# Patient Record
Sex: Female | Born: 1945 | Race: White | Hispanic: Yes | Smoking: Never smoker
Health system: Southern US, Community
[De-identification: ages and names within clinical notes are randomized; demographics above are authoritative.]

## PROBLEM LIST (undated history)

## (undated) DIAGNOSIS — I671 Cerebral aneurysm, nonruptured: Secondary | ICD-10-CM

## (undated) DIAGNOSIS — R51 Headache: Secondary | ICD-10-CM

## (undated) DIAGNOSIS — K219 Gastro-esophageal reflux disease without esophagitis: Secondary | ICD-10-CM

## (undated) DIAGNOSIS — N201 Calculus of ureter: Secondary | ICD-10-CM

## (undated) DIAGNOSIS — K648 Other hemorrhoids: Secondary | ICD-10-CM

## (undated) DIAGNOSIS — I10 Essential (primary) hypertension: Secondary | ICD-10-CM

## (undated) DIAGNOSIS — R319 Hematuria, unspecified: Secondary | ICD-10-CM

## (undated) DIAGNOSIS — R519 Headache, unspecified: Secondary | ICD-10-CM

## (undated) DIAGNOSIS — E785 Hyperlipidemia, unspecified: Secondary | ICD-10-CM

## (undated) HISTORY — DX: Headache, unspecified: R51.9

## (undated) HISTORY — DX: Headache: R51

## (undated) HISTORY — PX: ABDOMINAL HYSTERECTOMY: SHX81

## (undated) HISTORY — PX: APPENDECTOMY: SHX54

## (undated) HISTORY — PX: CARDIAC CATHETERIZATION: SHX172

---

## 2008-08-26 ENCOUNTER — Inpatient Hospital Stay (HOSPITAL_COMMUNITY): Admission: AD | Admit: 2008-08-26 | Discharge: 2008-08-27 | Payer: Self-pay | Admitting: Cardiovascular Disease

## 2008-10-18 ENCOUNTER — Inpatient Hospital Stay (HOSPITAL_COMMUNITY): Admission: EM | Admit: 2008-10-18 | Discharge: 2008-10-19 | Payer: Self-pay | Admitting: Emergency Medicine

## 2008-10-18 ENCOUNTER — Encounter (INDEPENDENT_AMBULATORY_CARE_PROVIDER_SITE_OTHER): Payer: Self-pay | Admitting: Neurology

## 2008-10-19 HISTORY — PX: TRANSTHORACIC ECHOCARDIOGRAM: SHX275

## 2008-11-09 ENCOUNTER — Encounter: Admission: RE | Admit: 2008-11-09 | Discharge: 2008-11-09 | Payer: Self-pay | Admitting: Cardiovascular Disease

## 2009-07-27 IMAGING — CR DG ABDOMEN 1V
1 series · 1 of 1 positions shown · non-contrast
Comparison: None

CLINICAL DATA: Abdominal pain, bloody stool

ABDOMEN - 1 VIEW

[t abdomen supine]
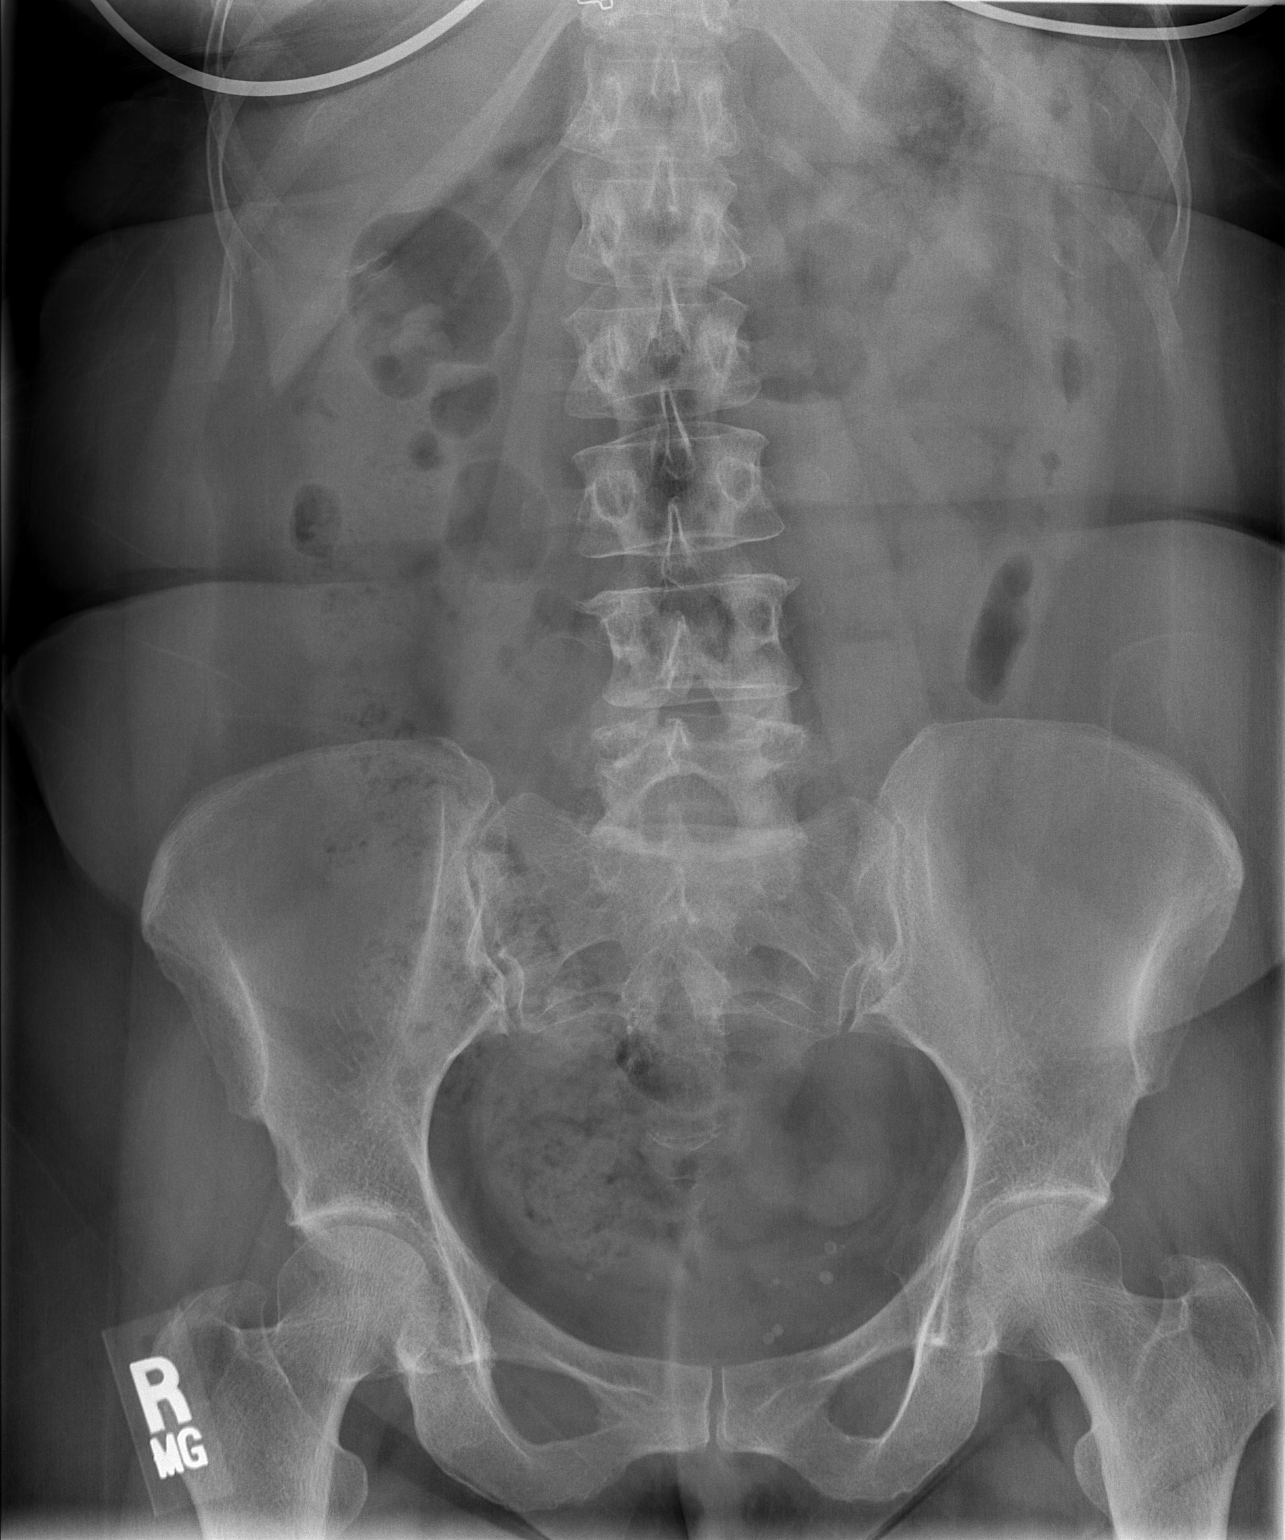

[1 of 1 positions shown; findings below may reference images not displayed]

FINDINGS: Bowel gas pattern is normal.  The colon is decompressed.
This may account for mild apparent prominence of the descending
colonic wall.  Phleboliths project over the left pelvis.  No acute
bony finding.  Presence or absence of air fluid levels or free air
cannot be assessed on this single supine view.
IMPRESSION: No acute intra-abdominal process on this single plain film.

## 2010-03-11 ENCOUNTER — Emergency Department (HOSPITAL_BASED_OUTPATIENT_CLINIC_OR_DEPARTMENT_OTHER): Admission: EM | Admit: 2010-03-11 | Discharge: 2010-03-11 | Payer: Self-pay | Admitting: Emergency Medicine

## 2010-03-11 ENCOUNTER — Ambulatory Visit: Payer: Self-pay | Admitting: Diagnostic Radiology

## 2010-05-03 ENCOUNTER — Encounter: Admission: RE | Admit: 2010-05-03 | Discharge: 2010-05-03 | Payer: Self-pay | Admitting: Cardiovascular Disease

## 2010-08-28 ENCOUNTER — Encounter: Admission: RE | Admit: 2010-08-28 | Discharge: 2010-08-28 | Payer: Self-pay | Admitting: Cardiovascular Disease

## 2010-09-14 ENCOUNTER — Encounter: Admission: RE | Admit: 2010-09-14 | Discharge: 2010-09-14 | Payer: Self-pay | Admitting: Cardiovascular Disease

## 2010-11-27 ENCOUNTER — Encounter: Payer: Self-pay | Admitting: Cardiovascular Disease

## 2010-12-06 ENCOUNTER — Encounter: Payer: Self-pay | Admitting: Cardiovascular Disease

## 2011-01-23 LAB — URINALYSIS, ROUTINE W REFLEX MICROSCOPIC
Bilirubin Urine: NEGATIVE
Ketones, ur: NEGATIVE mg/dL
Nitrite: NEGATIVE
Protein, ur: NEGATIVE mg/dL
Urobilinogen, UA: 0.2 mg/dL (ref 0.0–1.0)
pH: 6 (ref 5.0–8.0)

## 2011-01-23 LAB — COMPREHENSIVE METABOLIC PANEL
Albumin: 4.6 g/dL (ref 3.5–5.2)
BUN: 10 mg/dL (ref 6–23)
Chloride: 107 mEq/L (ref 96–112)
Creatinine, Ser: 0.8 mg/dL (ref 0.4–1.2)
GFR calc non Af Amer: >60 mL/min (ref >60)
Total Bilirubin: 0.4 mg/dL (ref 0.3–1.2)

## 2011-01-23 LAB — LIPASE, BLOOD: Lipase: 137 U/L (ref 23–300)

## 2011-01-23 LAB — CBC
HCT: 38.2 % (ref 36.0–46.0)
MCHC: 33.3 g/dL (ref 30.0–36.0)
MCV: 89.5 fL (ref 78.0–100.0)
Platelets: 350 10*3/uL (ref 150–400)
WBC: 6.4 10*3/uL (ref 4.0–10.5)

## 2011-01-23 LAB — DIFFERENTIAL
Basophils Absolute: 0.1 10*3/uL (ref 0.0–0.1)
Lymphocytes Relative: 32 % (ref 12–46)
Monocytes Absolute: 0.4 10*3/uL (ref 0.1–1.0)
Neutro Abs: 3.8 10*3/uL (ref 1.7–7.7)

## 2011-03-20 NOTE — Discharge Summary (Signed)
Natalie Willis, Natalie Willis               ACCOUNT NO.:  [id number]   MEDICAL RECORD NO.:  [id number]          PATIENT TYPE:  INP   LOCATION:  3738                         FACILITY:  [facility]   PHYSICIAN:  Ricki Rodriguez, M.D.  DATE OF BIRTH:  09-15-1946   DATE OF ADMISSION:  08/26/2008  DATE OF DISCHARGE:  08/27/2008                               DISCHARGE SUMMARY   FINAL DIAGNOSES:  1. Chest pain.  2. Hyperlipidemia.  3. Palpitation.  4. Anxiety.  5. Hypertension.   DISCHARGE MEDICATIONS:  1. Aspirin 81 mg one daily.  2. Metoprolol 25 mg one twice daily.  3. Lisinopril 40 mg one daily.  4. Omeprazole 40 mg one daily.  5. Singulair 10 mg as needed for allergy.  6. Albuterol inhaler 2 puffs four times daily as needed for asthma.  7. Ambien 5 mg at bedtime as needed.  8. Simvastatin 20 mg, one in the evening.   DISCHARGE ACTIVITY:  The patient to increase activity slowly after  taking it easy for 48 hours.   DISCHARGE DIET:  Low-sodium, heart-healthy diet.   FOLLOWUP:  Follow up by Dr. Orpah Cobb in 3-4 weeks.   HISTORY:  This 65 year old white female presented with exertional  dyspnea, palpitation, and she underwent regular stress test at the  office with the chest pain and significant ST depressions in anterior  lead.   PHYSICAL EXAMINATION:  VITAL SIGNS:  Temperature 98.5, pulse 69,  respirations 18, blood pressure 134/75, height 5 feet and 1 inch, weight  131 pounds.  The patient's oxygen saturation was 93% on room air.  GENERAL:  She is short, but averagely nourished and in no acute  distress.  HEENT:  The patient is normocephalic, atraumatic.  Eyes, brown.  Pupils  are equal and reactive to light.  Extraocular movement intact.  Conjunctivae pink.  Sclerae nonicteric.  NECK:  Supple.  LUNGS:  Clear.  HEART:  Normal S1 and S2.  ABDOMEN:  Soft and nontender.  EXTREMITIES:  No edema, cyanosis, or clubbing.  NEUROLOGIC:  Cranial nerves grossly intact and the patient was  alert and  oriented x3.  Moves all four extremities.   LABORATORY DATA:  Normal hemoglobin.  Hematocrit normal.  Normal  electrolytes, BUN, creatinine.  Normal glucose.  Normal CK-MB, troponin  I.   EKG, sinus rhythm with anterior ischemia.   Cardiac catheterization, normal coronaries and normal LV systolic  function.   HOSPITAL COURSE:  The patient was admitted to a telemetry unit,  myocardial infarction was ruled out.  She underwent cardiac  catheterization that showed normal coronaries and normal LV systolic  function.  The patient had elevated cholesterol level for which  simvastatin was started.  She also needed Ambien for anxiety and sleep  deprivation.  She was discharged home in satisfactory condition with  followup by me in 3-4 weeks.      Ricki Rodriguez, M.D.  Electronically Signed     ASK/MEDQ  D:  08/27/2008  T:  08/28/2008  Job:  [id number]

## 2011-03-20 NOTE — Discharge Summary (Signed)
NAMEEMINE, LOPATA               ACCOUNT NO.:  [id number]   MEDICAL RECORD NO.:  [id number]          PATIENT TYPE:  INP   LOCATION:  3001                         FACILITY:  [facility]   PHYSICIAN:  Pramod P. Pearlean Brownie, MD    DATE OF BIRTH:  [birth year]   DATE OF ADMISSION:  10/17/2008  DATE OF DISCHARGE:  10/19/2008                               DISCHARGE SUMMARY   DIAGNOSES AT THE TIME OF DISCHARGE:  1. Left body symptoms in the setting of negative MRI, likely right      brain transient ischemic attack versus possible migraine.  2. Hypertension.  3. Hyperlipidemia.  4. History of noncardiac chest discomfort with negative workup in      October 2009.  5. Anxiety.  6. History of previous stroke.  7. Left ophthalmic artery aneurysm, 4 x 5 mm.   MEDICINES AT THE TIME OF DISCHARGE:  1. Lisinopril 40 mg daily.  2. Amlodipine 5 mg a day.  3. Metoprolol 25 mg b.i.d.  4. Simvastatin 20 mg a day.  5. Aggrenox 1 p.o. nightly x2 weeks, then increase to b.i.d.  6. Aspirin 81 mg a day x2 weeks, then discontinue.   STUDIES PERFORMED:  1. 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  2. MRI of the brain shows no acute abnormality.  3. 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  There is      a 4 x 5 mm aneurysm of the left ophthalmic artery.  4. Carotid Doppler shows no ICA stenosis.  5. 2-D echocardiogram shows EF of 55-65% with no obvious source of      embolus.  6. Transcranial Doppler performed, results pending.  7. EKG shows normal sinus rhythm, T-wave abnormality, consider      anterior ischemia.   LABORATORY STUDIES:  Homocysteine 11.1 and hemoglobin A1c 5.8.  Distal  cardiac  enzymes normal.  Chemistry with glucose 106, otherwise normal.  Liver function tests normal.  Cholesterol was 161, triglycerides 95, HDL  40, and LDL 102.  Coagulation studies normal.  CBC with hemoglobin 11.5,  hematocrit 34.0, and red blood cells 3.71.  Lipase 39.  Urinalysis with  3-6 red blood cells, otherwise normal.   HISTORY OF PRESENT ILLNESS:  Ms. Cassiopeia Florentino is a 65 year old Hispanic  female who presents with a clear onset of weakness and numbness  involving the left arm and left leg as well as face around 10:30 p.m.  the night prior.  She presented to the emergency room as a code stroke  at 23:56 p.m.  CT of the head was unremarkable.  The patient had only  mild residual numbness of her arm and leg.  No focal weakness.  No  previous history of stroke or TIA.  She has been taking aspirin 81 mg a  day.  NIH stroke scale was 2 and t-PA was not given secondary to quick  resolution of symptoms.  She was admitted to the hospital for further  stroke workup.   HOSPITAL COURSE:  MRI was negative for acute stroke.  It is possible she  had a right brain TIA versus migraine.  She was changed from aspirin  alone to Aggrenox for secondary stroke prevention.  She is found to have  vascular risk factor of dyslipidemia with LDL of 102 already on statin  prior to admission as well as hypertension, which is under control.  She  has no new risk factors identified for intervention.  She has no therapy  needs and is safe for discharge home.   CONDITION AT DISCHARGE:  The patient is stable.  No residual neurologic  symptoms.  No focal weakness.   DISCHARGE PLAN:  1. Discharge home with family.  2. No PT, OT, or speech therapy needs.  3. Aggrenox for secondary stroke prevention.  4. Follow up with primary care physician within 1 month.  5. Follow up with Dr. Delia Heady in 2-3 months.      Annie Main, N.P.    ______________________________  Sunny Schlein. Pearlean Brownie, MD    SB/MEDQ  D:   10/19/2008  T:  10/19/2008  Job:  [id number]   cc:   Pramod P. Pearlean Brownie, MD  Ricki Rodriguez, M.D.

## 2011-03-20 NOTE — H&P (Signed)
Natalie Willis, HORNEY NO.:  [id number]   MEDICAL RECORD NO.:  [id number]          PATIENT TYPE:  INP   LOCATION:  3305                         FACILITY:  [facility]   PHYSICIAN:  Noel Christmas, MD    DATE OF BIRTH:  1946/05/06   DATE OF ADMISSION:  10/17/2008  DATE OF DISCHARGE:                              HISTORY & PHYSICAL   CHIEF COMPLAINT:  Acute onset of weakness and numbness involving the  left side.   A 65 year old lady who presented with a history of clear onset of  weakness and numbness involving the left arm and left leg as well as  numbness involving the left side of her face at about 10:30 last night.  She presented to the emergency room as a Code Stroke.  It was activated  at 23:56.  CT of her head was unremarkable.  The patient had mild  residual numbness involving her left arm and leg on presentation as well  as left side of her face.  No focal weakness was noted other than  equivocal weakness of left lower extremity.  No previous history of  stroke or TIA.  CT showed no signs of an acute intracranial abnormality  including no hemorrhage.  The patient has been on aspirin 81 mg per day.  Symptoms of numbness and weakness have improved since arriving in the  emergency room.  The patient's initial NIH stroke score was 2.  TPA  intervention was not elected.   PAST MEDICAL HISTORY:  Remarkable for hypertension, hyperlipidemia,  noncardiac chest discomfort with a history of negative cardiac  catheterization in October 2009.  The patient also has a history of  anxiety.  The patient is complaining of left-sided headache and has a  history of early frequent headaches of a similar nature.   CURRENT MEDICATIONS:  1. Lisinopril 40 mg per day.  2. Amlodipine 5 mg per day.  3. Metoprolol 25 mg b.i.d.  4. Simvastatin 20 mg per day.  5. Aspirin 81 mg per day.   ALLERGIES:  The patient has no known drug allergies.  She has a number  of food allergies;  however, including allergy to PEANUTS, DAIRY  PRODUCTS, CHOCOLATE, and CORN.   FAMILY HISTORY:  Strongly positive for stroke involving both parents.  Family history is also strongly positive for hypertension involving all  of her siblings.  Several of her siblings have also had heart problems.  The nature of the heart problems is unclear.   SOCIAL HISTORY:  The patient is an immigrant from [place]  She is  separated from her spouse and recently moved to this area from [place],  [place] to be near her daughter, who lives about 30 minutes away  from her.  She does not use tobacco and does not drink alcohol, and has  no history of illicit drug use.   REVIEW OF SYSTEMS:  NEUROLOGIC:  As above.  CARDIOVASCULAR:  As above.  PULMONARY:  Negative.  GASTROINTESTINAL:  The patient has been  experiencing epigastric discomfort including tonight.  GENITOURINARY:  Negative.  MUSCULOSKELETAL:  Negative.  ENDOCRINE:  Negative.  REPRODUCTIVE:  Negative.  HEMATOLOGIC\LYMPHATIC:  Negative.  ALLERGIC\IMMUNOLOGIC:  As above.  PSYCHIATRIC:  Chronic anxiety, but  otherwise unremarkable.   PHYSICAL EXAMINATION:  VITAL SIGNS:  Temperature was 98.3, pulse 90 per  minute, respirations 19 per minute, blood pressure was 163/80, and  oxygen saturation was 100% on 2 L of oxygen by nasal cannula.  APPEARANCE:  Appearance is that of a middle-aged lady who was of medium  built, slightly overweight.  She was alert and cooperative and in no  acute distress.  She was oriented to time as well as place.  She had no  receptive or expressive aphasia.  HEAD, EYES, EARS, NOSE, AND THROAT:  Normal.  NECK:  Supple.  No masses or tenderness.  CHEST:  Clear to auscultation.  HEART:  Rate and rhythm were normal.  She had normal S1 and S2 as well  as no abnormal heart sounds.  ABDOMEN:  Soft.  She had slight discomfort to pressure and palpation in  the epigastric region.  Bowel sounds were normal.  Abdomen was  otherwise  unremarkable.  SKIN AND MUCOSA:  Normal.  Peripheral pulses were normal including  distal and lower extremities.  NEUROLOGICAL:  Pupils are equal, reactive normally to light.  Extraocular movements and visual fields were normal.  She had slight  left facial numbness, but no facial weakness.  Hearing was normal.  Speech and palatal movement were normal.  EXTREMITIES:  Strength of extremities were normal proximally and  distally throughout except for equivocal drift against gravity over the  left lower extremity.  Strength with manual muscle testing was normal.  Muscle tone was normal throughout.  Deep tendon reflexes were normal and  symmetrical throughout.  Plantar responses were flexor.  Sensory exam  was normal except for reduced perception of touch over the left leg and  foot compared to the right.  Carotid auscultation was normal.   CLINICAL IMPRESSION:  Possible acute right middle cerebral artery  territory ischemic event, transient ischemic attack versus stroke.  Subcortical stroke will need to be ruled out.   PLAN:  1. Admit to unit 3000 on telemetry.  2. MRI of the brain as well as MRA without contrast in the a.m.  3. Carotid Doppler study in the a.m.  4. Echocardiogram can likely be deferred since she had cardiac      catheterization slightly less than 2 months ago.  5. Aggrenox b.i.d. for antiplatelet therapy.      Noel Christmas, MD  Electronically Signed     CS/MEDQ  D:  10/18/2008  T:  10/18/2008  Job:  [id number]

## 2011-08-07 LAB — PROTIME-INR: Prothrombin Time: 13.1

## 2011-08-07 LAB — CBC
MCHC: 33.7
Platelets: 323
RBC: 3.92
RDW: 13.4
RDW: 13.5

## 2011-08-07 LAB — BASIC METABOLIC PANEL
BUN: 9
Chloride: 106
GFR calc non Af Amer: >60
Glucose, Bld: 108 — ABNORMAL HIGH
Potassium: 3.7
Potassium: 3.8
Sodium: 141

## 2011-08-07 LAB — LIPID PANEL
Cholesterol: 227 — ABNORMAL HIGH
LDL Cholesterol: 157 — ABNORMAL HIGH
VLDL: 29

## 2011-08-07 LAB — CARDIAC PANEL(CRET KIN+CKTOT+MB+TROPI)
CK, MB: 0.7
CK, MB: 0.8
Relative Index: INVALID
Total CK: 52
Total CK: 66
Troponin I: 0.01
Troponin I: <0.01

## 2011-08-07 LAB — HEPARIN LEVEL (UNFRACTIONATED): Heparin Unfractionated: 1.2 — ABNORMAL HIGH

## 2011-08-10 LAB — GLUCOSE, CAPILLARY: Glucose-Capillary: 134 mg/dL — ABNORMAL HIGH (ref 70–99)

## 2011-08-10 LAB — HEPATIC FUNCTION PANEL
ALT: 20 U/L (ref 0–35)
AST: 28 U/L (ref 0–37)
Albumin: 4.1 g/dL (ref 3.5–5.2)
Alkaline Phosphatase: 79 U/L (ref 39–117)
Total Bilirubin: 0.4 mg/dL (ref 0.3–1.2)
Total Protein: 7.2 g/dL (ref 6.0–8.3)

## 2011-08-10 LAB — CBC
HCT: 34 % — ABNORMAL LOW (ref 36.0–46.0)
HCT: 36.8 % (ref 36.0–46.0)
Hemoglobin: 11.5 g/dL — ABNORMAL LOW (ref 12.0–15.0)
Hemoglobin: 12.3 g/dL (ref 12.0–15.0)
MCV: 91.2 fL (ref 78.0–100.0)
RBC: 3.74 MIL/uL — ABNORMAL LOW (ref 3.87–5.11)
RBC: 4.04 MIL/uL (ref 3.87–5.11)
WBC: 6.2 10*3/uL (ref 4.0–10.5)
WBC: 7.2 10*3/uL (ref 4.0–10.5)

## 2011-08-10 LAB — LIPID PANEL
Cholesterol: 161 mg/dL (ref 0–200)
HDL: 40 mg/dL (ref >39)
Total CHOL/HDL Ratio: 4 RATIO
VLDL: 19 mg/dL (ref 0–40)

## 2011-08-10 LAB — COMPREHENSIVE METABOLIC PANEL
ALT: 17 U/L (ref 0–35)
ALT: 20 U/L (ref 0–35)
AST: 26 U/L (ref 0–37)
Alkaline Phosphatase: 64 U/L (ref 39–117)
BUN: 7 mg/dL (ref 6–23)
CO2: 24 mEq/L (ref 19–32)
CO2: 27 mEq/L (ref 19–32)
Chloride: 105 mEq/L (ref 96–112)
Chloride: 108 mEq/L (ref 96–112)
GFR calc Af Amer: >60 mL/min (ref >60)
GFR calc non Af Amer: >60 mL/min (ref >60)
Glucose, Bld: 106 mg/dL — ABNORMAL HIGH (ref 70–99)
Potassium: 3.7 mEq/L (ref 3.5–5.1)
Potassium: 4 mEq/L (ref 3.5–5.1)
Sodium: 140 mEq/L (ref 135–145)
Sodium: 141 mEq/L (ref 135–145)
Total Bilirubin: 0.4 mg/dL (ref 0.3–1.2)
Total Bilirubin: 0.6 mg/dL (ref 0.3–1.2)
Total Protein: 6.3 g/dL (ref 6.0–8.3)

## 2011-08-10 LAB — PROTIME-INR
Prothrombin Time: 11.9 seconds (ref 11.6–15.2)
Prothrombin Time: 12.8 seconds (ref 11.6–15.2)

## 2011-08-10 LAB — URINE MICROSCOPIC-ADD ON

## 2011-08-10 LAB — DIFFERENTIAL
Basophils Absolute: 0 10*3/uL (ref 0.0–0.1)
Eosinophils Absolute: 0.1 10*3/uL (ref 0.0–0.7)
Eosinophils Relative: 1 % (ref 0–5)
Monocytes Absolute: 0.5 10*3/uL (ref 0.1–1.0)

## 2011-08-10 LAB — URINALYSIS, ROUTINE W REFLEX MICROSCOPIC
Glucose, UA: NEGATIVE mg/dL
Leukocytes, UA: NEGATIVE
Nitrite: NEGATIVE
Protein, ur: NEGATIVE mg/dL

## 2011-08-10 LAB — CARDIAC PANEL(CRET KIN+CKTOT+MB+TROPI)
CK, MB: 0.7 ng/mL (ref 0.3–4.0)
Total CK: 48 U/L (ref 7–177)

## 2011-08-10 LAB — HOMOCYSTEINE: Homocysteine: 11.1 umol/L (ref 4.0–15.4)

## 2011-08-10 LAB — LIPASE, BLOOD: Lipase: 39 U/L (ref 11–59)

## 2011-09-17 ENCOUNTER — Other Ambulatory Visit: Payer: Self-pay | Admitting: Cardiovascular Disease

## 2011-09-17 ENCOUNTER — Ambulatory Visit
Admission: RE | Admit: 2011-09-17 | Discharge: 2011-09-17 | Disposition: A | Discharge status: Home / Self Care | Payer: Medicaid Other | Source: Ambulatory Visit | Attending: Cardiovascular Disease | Admitting: Cardiovascular Disease

## 2011-09-17 DIAGNOSIS — R05 Cough: Secondary | ICD-10-CM

## 2012-01-15 ENCOUNTER — Other Ambulatory Visit: Payer: Self-pay | Admitting: Gastroenterology

## 2012-01-17 ENCOUNTER — Ambulatory Visit
Admission: RE | Admit: 2012-01-17 | Discharge: 2012-01-17 | Disposition: A | Discharge status: Home / Self Care | Payer: Medicaid Other | Source: Ambulatory Visit | Attending: Gastroenterology | Admitting: Gastroenterology

## 2012-01-17 MED ORDER — IOHEXOL 300 MG/ML  SOLN
100.0000 mL | Freq: Once | INTRAMUSCULAR | Status: AC | PRN
  Administered 2012-01-17: 100 mL via INTRAVENOUS

## 2012-02-15 HISTORY — PX: COLONOSCOPY: SHX174

## 2012-12-09 ENCOUNTER — Other Ambulatory Visit (HOSPITAL_COMMUNITY): Payer: Self-pay | Admitting: Cardiovascular Disease

## 2012-12-09 DIAGNOSIS — M810 Age-related osteoporosis without current pathological fracture: Secondary | ICD-10-CM

## 2012-12-17 ENCOUNTER — Ambulatory Visit (HOSPITAL_COMMUNITY)
Admission: RE | Admit: 2012-12-17 | Discharge: 2012-12-17 | Disposition: A | Discharge status: Home / Self Care | Payer: Medicaid Other | Source: Ambulatory Visit | Attending: Cardiovascular Disease | Admitting: Cardiovascular Disease

## 2012-12-17 ENCOUNTER — Ambulatory Visit (HOSPITAL_COMMUNITY): Payer: Medicaid Other

## 2012-12-17 DIAGNOSIS — M899 Disorder of bone, unspecified: Secondary | ICD-10-CM | POA: Insufficient documentation

## 2012-12-17 DIAGNOSIS — M949 Disorder of cartilage, unspecified: Secondary | ICD-10-CM | POA: Insufficient documentation

## 2012-12-17 DIAGNOSIS — Z780 Asymptomatic menopausal state: Secondary | ICD-10-CM | POA: Insufficient documentation

## 2012-12-17 DIAGNOSIS — M810 Age-related osteoporosis without current pathological fracture: Secondary | ICD-10-CM

## 2012-12-17 DIAGNOSIS — Z13820 Encounter for screening for osteoporosis: Secondary | ICD-10-CM | POA: Insufficient documentation

## 2013-01-06 ENCOUNTER — Ambulatory Visit
Admission: RE | Admit: 2013-01-06 | Discharge: 2013-01-06 | Disposition: A | Discharge status: Home / Self Care | Payer: Medicaid Other | Source: Ambulatory Visit | Attending: Cardiovascular Disease | Admitting: Cardiovascular Disease

## 2013-01-06 ENCOUNTER — Other Ambulatory Visit: Payer: Self-pay | Admitting: Cardiovascular Disease

## 2013-01-06 DIAGNOSIS — M25521 Pain in right elbow: Secondary | ICD-10-CM

## 2013-01-26 ENCOUNTER — Emergency Department (HOSPITAL_COMMUNITY)
Admission: EM | Admit: 2013-01-26 | Discharge: 2013-01-27 | Disposition: A | Discharge status: Home / Self Care | Payer: Medicaid Other | Attending: Emergency Medicine | Admitting: Emergency Medicine

## 2013-01-26 ENCOUNTER — Encounter (HOSPITAL_COMMUNITY): Payer: Self-pay | Admitting: *Deleted

## 2013-01-26 DIAGNOSIS — I10 Essential (primary) hypertension: Secondary | ICD-10-CM | POA: Insufficient documentation

## 2013-01-26 DIAGNOSIS — Z79899 Other long term (current) drug therapy: Secondary | ICD-10-CM | POA: Insufficient documentation

## 2013-01-26 DIAGNOSIS — R109 Unspecified abdominal pain: Secondary | ICD-10-CM | POA: Insufficient documentation

## 2013-01-26 DIAGNOSIS — Z8669 Personal history of other diseases of the nervous system and sense organs: Secondary | ICD-10-CM | POA: Insufficient documentation

## 2013-01-26 HISTORY — DX: Essential (primary) hypertension: I10

## 2013-01-26 LAB — COMPREHENSIVE METABOLIC PANEL
ALT: 10 U/L (ref 0–35)
Alkaline Phosphatase: 77 U/L (ref 39–117)
BUN: 13 mg/dL (ref 6–23)
CO2: 28 mEq/L (ref 19–32)
Calcium: 8.7 mg/dL (ref 8.4–10.5)
GFR calc Af Amer: >90 mL/min (ref >90)
GFR calc non Af Amer: 87 mL/min — ABNORMAL LOW (ref >90)
Glucose, Bld: 110 mg/dL — ABNORMAL HIGH (ref 70–99)
Potassium: 4 mEq/L (ref 3.5–5.1)
Sodium: 142 mEq/L (ref 135–145)
Total Protein: 7.6 g/dL (ref 6.0–8.3)

## 2013-01-26 LAB — URINALYSIS, ROUTINE W REFLEX MICROSCOPIC
Bilirubin Urine: NEGATIVE
Nitrite: NEGATIVE
Protein, ur: NEGATIVE mg/dL
Urobilinogen, UA: 0.2 mg/dL (ref 0.0–1.0)

## 2013-01-26 LAB — LIPASE, BLOOD: Lipase: 42 U/L (ref 11–59)

## 2013-01-26 LAB — CBC WITH DIFFERENTIAL/PLATELET
Eosinophils Absolute: 0.2 10*3/uL (ref 0.0–0.7)
Eosinophils Relative: 3 % (ref 0–5)
Hemoglobin: 12 g/dL (ref 12.0–15.0)
Lymphocytes Relative: 35 % (ref 12–46)
Lymphs Abs: 2.5 10*3/uL (ref 0.7–4.0)
MCH: 30.1 pg (ref 26.0–34.0)
MCV: 89.2 fL (ref 78.0–100.0)
Monocytes Relative: 7 % (ref 3–12)
Platelets: 319 10*3/uL (ref 150–400)
RBC: 3.99 MIL/uL (ref 3.87–5.11)
WBC: 7.3 10*3/uL (ref 4.0–10.5)

## 2013-01-26 MED ORDER — ONDANSETRON HCL 4 MG/2ML IJ SOLN
4.0000 mg | Freq: Once | INTRAMUSCULAR | Status: AC
  Administered 2013-01-26: 4 mg via INTRAVENOUS
  Filled 2013-01-26: qty 2

## 2013-01-26 NOTE — ED Notes (Signed)
Per translator pt c/o pelvic pain x 18 months; states is every day; worse today states has radiated to upper abd; nauseated; denies diarrhea

## 2013-01-27 ENCOUNTER — Emergency Department (HOSPITAL_COMMUNITY): Payer: Medicaid Other

## 2013-01-27 MED ORDER — GI COCKTAIL ~~LOC~~
30.0000 mL | Freq: Once | ORAL | Status: AC
  Administered 2013-01-27: 30 mL via ORAL
  Filled 2013-01-27: qty 30

## 2013-01-27 MED ORDER — SODIUM CHLORIDE 0.9 % IV BOLUS (SEPSIS)
1000.0000 mL | Freq: Once | INTRAVENOUS | Status: AC
  Administered 2013-01-27: 1000 mL via INTRAVENOUS

## 2013-01-27 MED ORDER — MORPHINE SULFATE 4 MG/ML IJ SOLN
4.0000 mg | Freq: Once | INTRAMUSCULAR | Status: AC
  Administered 2013-01-27: 4 mg via INTRAVENOUS
  Filled 2013-01-27: qty 1

## 2013-01-27 NOTE — ED Notes (Signed)
Patient transported to Ultrasound 

## 2013-01-27 NOTE — ED Provider Notes (Signed)
History     CSN: [id number]  Arrival date & time 01/26/13  2119   First MD Initiated Contact with Patient 01/26/13 2338      Chief Complaint  Patient presents with  . Abdominal Pain   HPI  History provided by the patient and family. Patient is a 67 year old Hispanic female with history of hypertension, appendectomy, abdominal hysterectomy and chronic abdominal pains who presents with complaints of worsened abdominal pain. Patient has had issues with diffuse abdominal pains for greater than one year. Pain is generally lower in the pelvis and she has been worked up with OB/GYN and several other specialists including emergency room visits without any specific or concerning cause. Pain is generally ongoing in a daily issue. Some days are better than others. Today patient has had continued epigastric and right upper quadrant abdominal pains persistent and more severe than typical pains. Symptoms have been associated with slight nausea and decreased appetite. Patient also reports some subjective chills. Pain does not seem worse with any attempts to eat or drink. She denies any specific aggravating or alleviating factors. Denies any episodes of vomiting. No diarrhea or constipation. No urinary complaints. No other associated symptoms.    Past Medical History  Diagnosis Date  . Hypertension   . Cerebral aneurysm     Past Surgical History  Procedure Laterality Date  . Appendectomy    . Abdominal hysterectomy      No family history on file.  History  Substance Use Topics  . Smoking status: Never Smoker   . Smokeless tobacco: Not on file  . Alcohol Use: No    OB History   Grav Para Term Preterm Abortions TAB SAB Ect Mult Living                  Review of Systems  Constitutional: Positive for chills and appetite change. Negative for fever.  Respiratory: Negative for shortness of breath.   Cardiovascular: Negative for chest pain.  Gastrointestinal: Positive for nausea and  abdominal pain. Negative for vomiting, diarrhea and constipation.  Genitourinary: Negative for dysuria, frequency, hematuria, flank pain, vaginal bleeding and vaginal discharge.  All other systems reviewed and are negative.    Allergies  Review of patient's allergies indicates no known allergies.  Home Medications   Current Outpatient Rx  Name  Route  Sig  Dispense  Refill  . acetaminophen (TYLENOL) 325 MG tablet   Oral   Take 650 mg by mouth every 6 (six) hours as needed for pain.         Marland Kitchen albuterol (PROVENTIL HFA;VENTOLIN HFA) 108 (90 BASE) MCG/ACT inhaler   Inhalation   Inhale 2 puffs into the lungs every 6 (six) hours as needed for wheezing.         Marland Kitchen amLODipine (NORVASC) 5 MG tablet   Oral   Take 5 mg by mouth daily.         Marland Kitchen lisinopril (PRINIVIL,ZESTRIL) 40 MG tablet   Oral   Take 40 mg by mouth daily.         . metoprolol tartrate (LOPRESSOR) 25 MG tablet   Oral   Take 25 mg by mouth 2 (two) times daily.         Marland Kitchen omeprazole (PRILOSEC) 20 MG capsule   Oral   Take 20 mg by mouth daily.         . simvastatin (ZOCOR) 20 MG tablet   Oral   Take 20 mg by mouth every evening.         Marland Kitchen  zolpidem (AMBIEN) 5 MG tablet   Oral   Take 5 mg by mouth at bedtime as needed for sleep.           BP 147/82  Pulse 85  Temp(Src) 99.6 F (37.6 C) (Oral)  Resp 20  SpO2 97%  Physical Exam  Nursing note and vitals reviewed. Constitutional: She is oriented to person, place, and time. She appears well-developed and well-nourished. No distress.  HENT:  Head: Normocephalic.  Cardiovascular: Normal rate and regular rhythm.   Pulmonary/Chest: Effort normal and breath sounds normal.  Abdominal: Soft. There is tenderness in the right upper quadrant and epigastric area. There is no rebound, no guarding, no tenderness at McBurney's point and negative Murphy's sign.  Musculoskeletal: Normal range of motion.  Neurological: She is alert and oriented to person, place,  and time.  Skin: Skin is warm and dry. No rash noted.  Psychiatric: She has a normal mood and affect. Her behavior is normal.    ED Course  Procedures   Results for orders placed during the hospital encounter of 01/26/13  CBC WITH DIFFERENTIAL      Result Value Range   WBC 7.3  4.0 - 10.5 K/uL   RBC 3.99  3.87 - 5.11 MIL/uL   Hemoglobin 12.0  12.0 - 15.0 g/dL   HCT 01.6 (*) 01.0 - 93.2 %   MCV 89.2  78.0 - 100.0 fL   MCH 30.1  26.0 - 34.0 pg   MCHC 33.7  30.0 - 36.0 g/dL   RDW 35.5  73.2 - 20.2 %   Platelets 319  150 - 400 K/uL   Neutrophils Relative 55  43 - 77 %   Neutro Abs 4.0  1.7 - 7.7 K/uL   Lymphocytes Relative 35  12 - 46 %   Lymphs Abs 2.5  0.7 - 4.0 K/uL   Monocytes Relative 7  3 - 12 %   Monocytes Absolute 0.5  0.1 - 1.0 K/uL   Eosinophils Relative 3  0 - 5 %   Eosinophils Absolute 0.2  0.0 - 0.7 K/uL   Basophils Relative 0  0 - 1 %   Basophils Absolute 0.0  0.0 - 0.1 K/uL  COMPREHENSIVE METABOLIC PANEL      Result Value Range   Sodium 142  135 - 145 mEq/L   Potassium 4.0  3.5 - 5.1 mEq/L   Chloride 105  96 - 112 mEq/L   CO2 28  19 - 32 mEq/L   Glucose, Bld 110 (*) 70 - 99 mg/dL   BUN 13  6 - 23 mg/dL   Creatinine, Ser 5.42  0.50 - 1.10 mg/dL   Calcium 8.7  8.4 - 70.6 mg/dL   Total Protein 7.6  6.0 - 8.3 g/dL   Albumin 3.9  3.5 - 5.2 g/dL   AST 15  0 - 37 U/L   ALT 10  0 - 35 U/L   Alkaline Phosphatase 77  39 - 117 U/L   Total Bilirubin 0.2 (*) 0.3 - 1.2 mg/dL   GFR calc non Af Amer 87 (*) >90 mL/min   GFR calc Af Amer >90  >90 mL/min  LIPASE, BLOOD      Result Value Range   Lipase 42  11 - 59 U/L  URINALYSIS, ROUTINE W REFLEX MICROSCOPIC      Result Value Range   Color, Urine YELLOW  YELLOW   APPearance CLEAR  CLEAR   Specific Gravity, Urine 1.008  1.005 - 1.030  pH 7.0  5.0 - 8.0   Glucose, UA NEGATIVE  NEGATIVE mg/dL   Hgb urine dipstick SMALL (*) NEGATIVE   Bilirubin Urine NEGATIVE  NEGATIVE   Ketones, ur NEGATIVE  NEGATIVE mg/dL    Protein, ur NEGATIVE  NEGATIVE mg/dL   Urobilinogen, UA 0.2  0.0 - 1.0 mg/dL   Nitrite NEGATIVE  NEGATIVE   Leukocytes, UA NEGATIVE  NEGATIVE  URINE MICROSCOPIC-ADD ON      Result Value Range   Squamous Epithelial / LPF RARE  RARE       US Abdomen Complete  01/27/2013  *RADIOLOGY REPORT*  Clinical Data:  Abdominal pain.  COMPLETE ABDOMINAL ULTRASOUND  Comparison:  CT scan 01/17/2012.  Findings:  Gallbladder:  No gallstones, gallbladder wall thickening, or pericholecystic fluid.  Common bile duct:  Normal in caliber measuring a maximum of 5.6mm.  Liver:  The liver is sonographically unremarkable.  There is normal echogenicity without focal lesions or intrahepatic biliary dilatation.  IVC:  Normal caliber.  Pancreas:  Sonographically normal.  Spleen:  Normal size and echogenicity without focal lesions.  Right Kidney:  11.4 cm in length. Normal renal cortical thickness and echogenicity without focal lesions or hydronephrosis.  Left Kidney:  10.8 cm in length. Normal renal cortical thickness and echogenicity without focal lesions or hydronephrosis.  Abdominal aorta:  Normal caliber.  IMPRESSION: Unremarkable abdominal ultrasound examination.   Original Report Authenticated By: Rudie Meyer, M.D.      1. Abdominal pain       MDM  11:40 p.m. patient seen and evaluated. Patient currently lying comfortably appears in no acute distress. Patient is not appears ill or toxic.  Patient with history of chronic abdominal pains with several evaluations and workup in the past. Pain is somewhat similar located higher in the abdomen today also more intense. Will obtain ultrasound to evaluate biliary cause. Labs unremarkable.  Patient feeling much better after medications. Ultrasound unremarkable. At this time she is felt able to return home and continue additional followup with PCP and specialist for her ongoing abdominal pain symptoms.   Angus Seller, PA-C 01/27/13 2047

## 2013-01-28 NOTE — ED Provider Notes (Signed)
Medical screening examination/treatment/procedure(s) were performed by non-physician practitioner and as supervising physician I was immediately available for consultation/collaboration.  Sunnie Nielsen, MD 01/28/13 [id number]

## 2013-03-31 ENCOUNTER — Ambulatory Visit
Admission: RE | Admit: 2013-03-31 | Discharge: 2013-03-31 | Disposition: A | Discharge status: Home / Self Care | Payer: Medicaid Other | Source: Ambulatory Visit | Attending: Cardiovascular Disease | Admitting: Cardiovascular Disease

## 2013-03-31 ENCOUNTER — Other Ambulatory Visit: Payer: Self-pay | Admitting: Cardiovascular Disease

## 2013-03-31 DIAGNOSIS — J45909 Unspecified asthma, uncomplicated: Secondary | ICD-10-CM

## 2013-06-24 ENCOUNTER — Other Ambulatory Visit: Payer: Self-pay | Admitting: Cardiovascular Disease

## 2013-06-24 DIAGNOSIS — I729 Aneurysm of unspecified site: Secondary | ICD-10-CM

## 2013-07-02 ENCOUNTER — Ambulatory Visit
Admission: RE | Admit: 2013-07-02 | Discharge: 2013-07-02 | Disposition: A | Discharge status: Home / Self Care | Payer: Medicaid Other | Source: Ambulatory Visit | Attending: Cardiovascular Disease | Admitting: Cardiovascular Disease

## 2013-07-02 DIAGNOSIS — I729 Aneurysm of unspecified site: Secondary | ICD-10-CM

## 2013-10-14 IMAGING — US US ABDOMEN COMPLETE
1 series · 14 of 25 positions shown · non-contrast
Comparison: CT scan 01/17/2012.

CLINICAL DATA: Abdominal pain.

COMPLETE ABDOMINAL ULTRASOUND

[Series 1: us abdomen complete · 0.34mm/px · 14 of 52 slices shown]
[im 1/52]
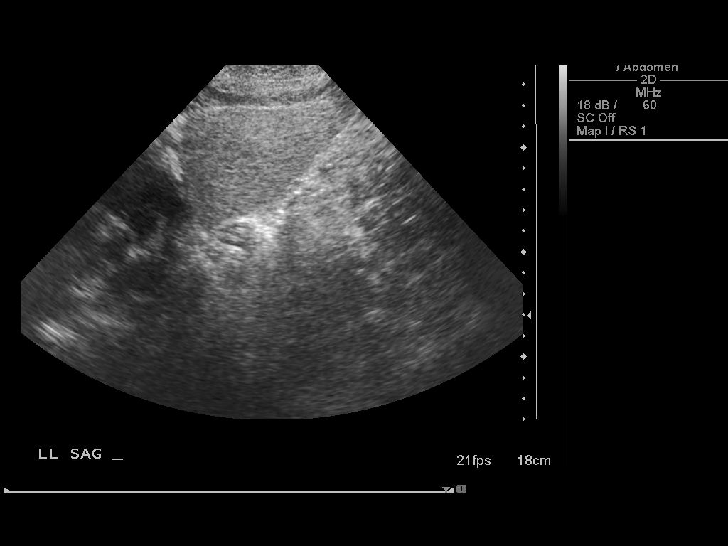
[im 5/52]
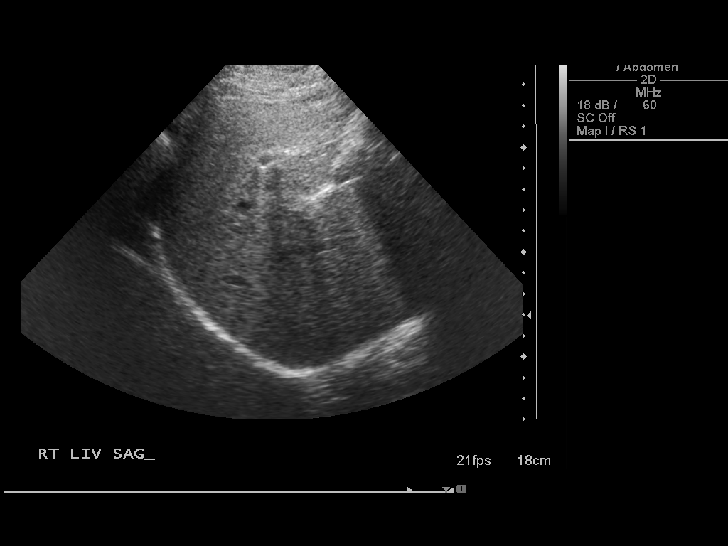
[im 9/52]
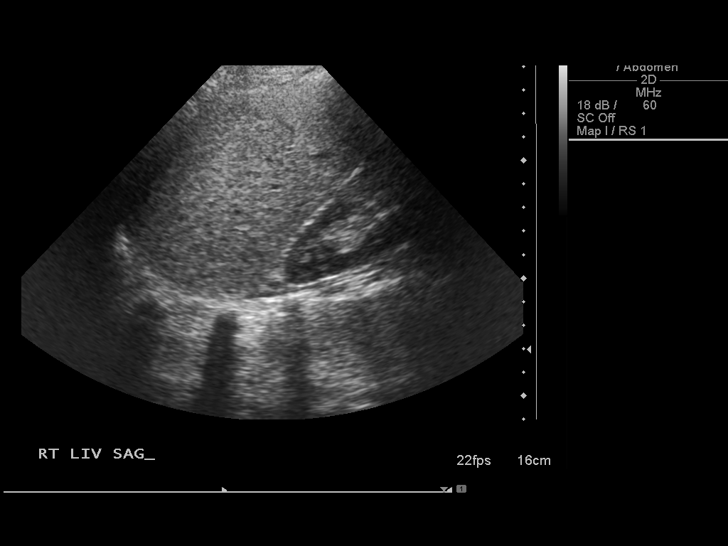
[im 13/52]
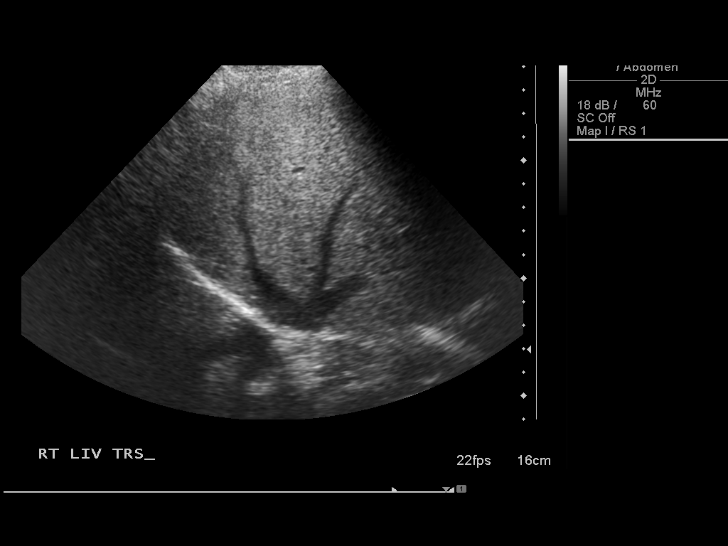
[im 18/52]
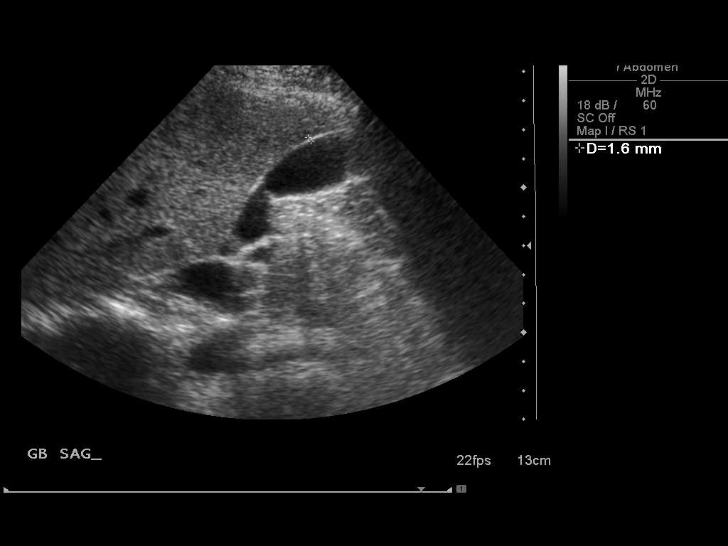
[im 20/52]
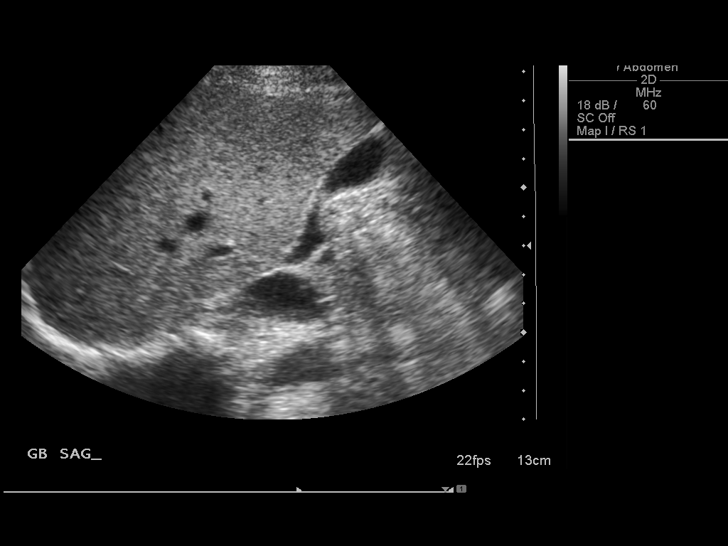
[im 24/52]
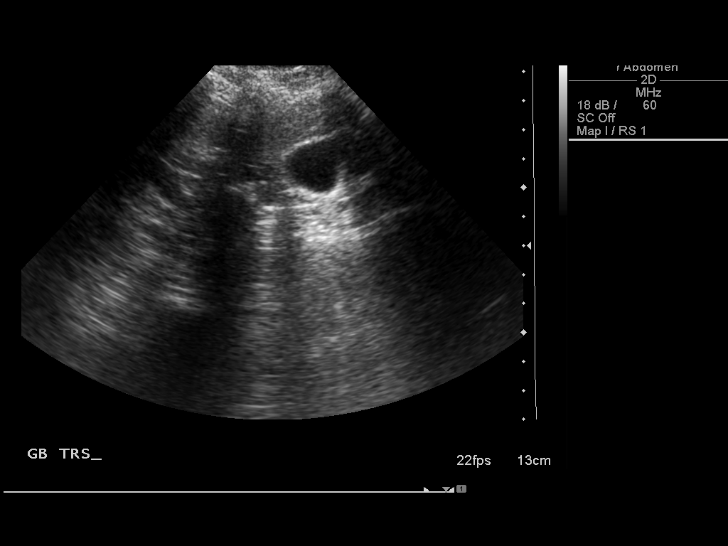
[im 28/52]
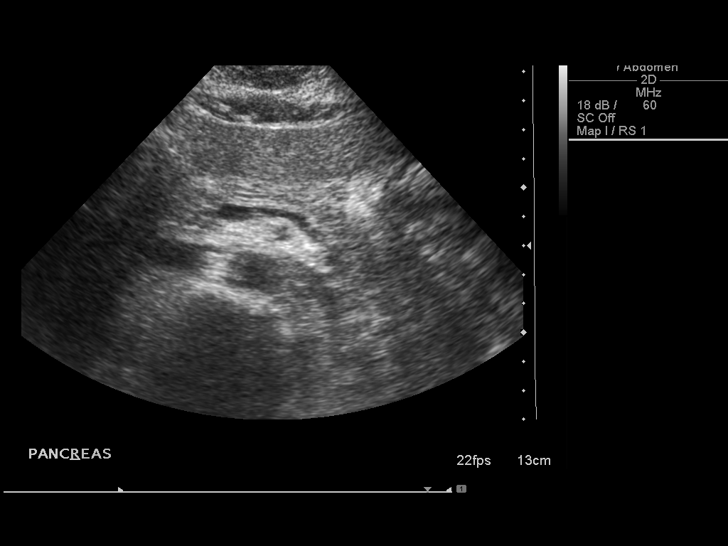
[im 32/52]
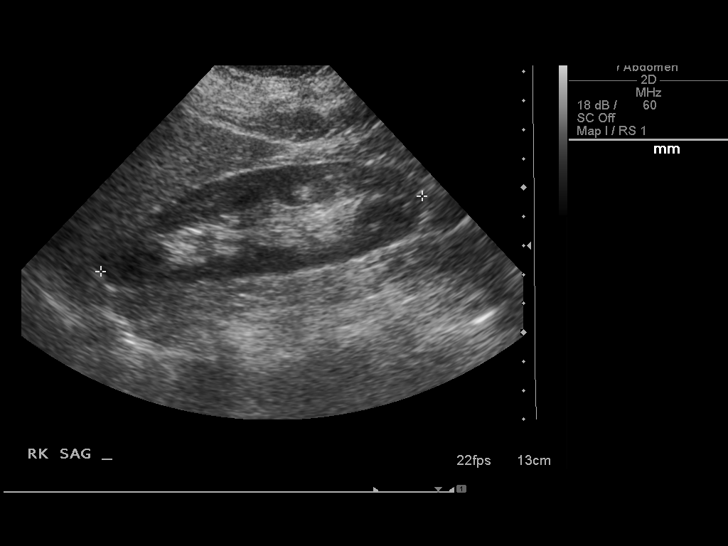
[im 35/52]
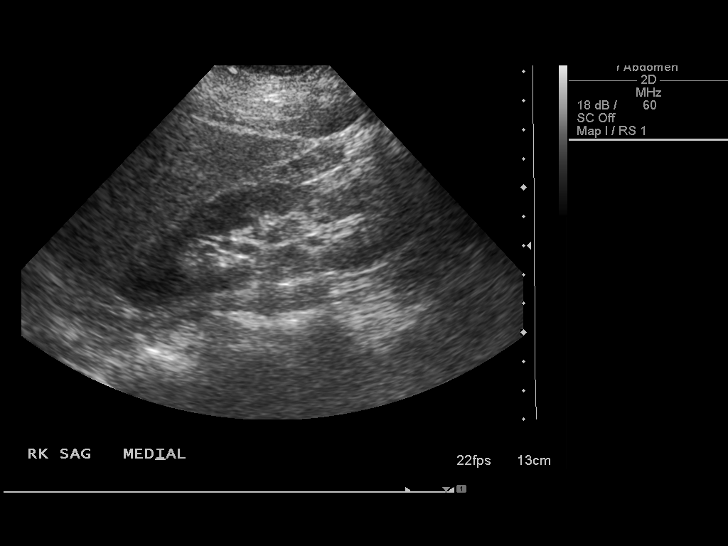
[im 39/52]
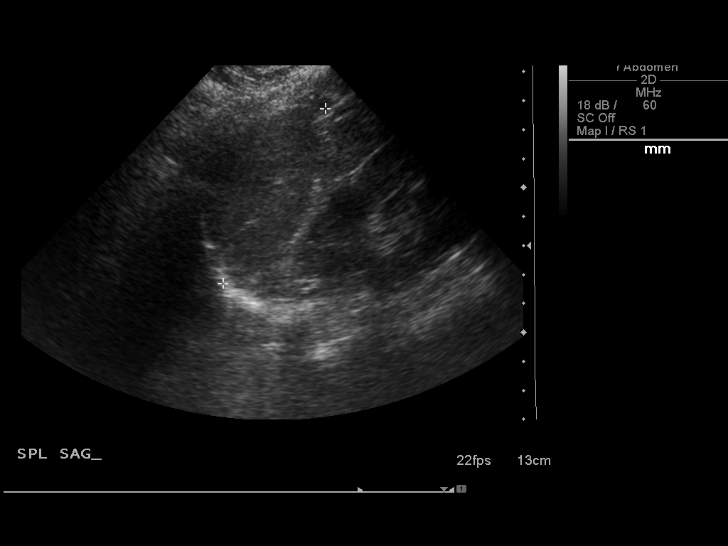
[im 43/52]
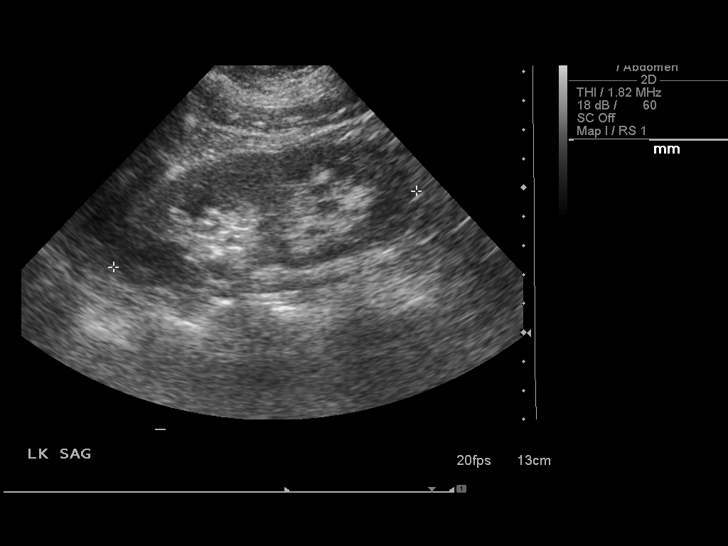
[im 47/52]
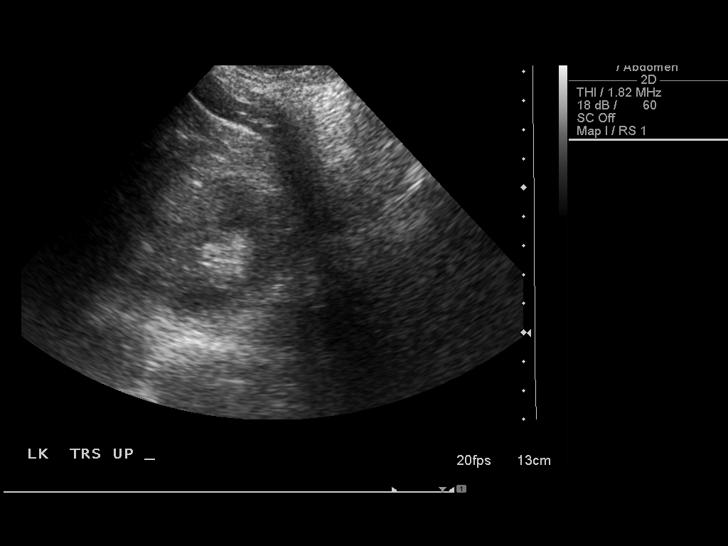
[im 52/52]
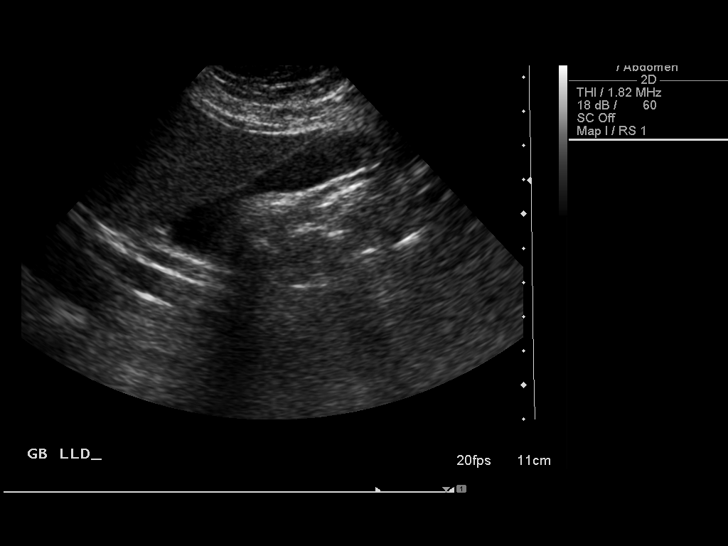

[14 of 25 positions shown; findings below may reference images not displayed]

FINDINGS: Gallbladder:  No gallstones, gallbladder wall thickening, or
pericholecystic fluid.

Common bile duct:  Normal in caliber measuring a maximum of 5.5mm.

Liver:  The liver is sonographically unremarkable.  There is normal
echogenicity without focal lesions or intrahepatic biliary
dilatation.

IVC:  Normal caliber.

Pancreas:  Sonographically normal.

Spleen:  Normal size and echogenicity without focal lesions.

Right Kidney:  11.4 cm in length. Normal renal cortical thickness
and echogenicity without focal lesions or hydronephrosis.

Left Kidney:  10.8 cm in length. Normal renal cortical thickness
and echogenicity without focal lesions or hydronephrosis.

Abdominal aorta:  Normal caliber.
IMPRESSION: Unremarkable abdominal ultrasound examination.

## 2014-01-07 ENCOUNTER — Other Ambulatory Visit: Payer: Self-pay | Admitting: Cardiovascular Disease

## 2014-01-07 DIAGNOSIS — Z1231 Encounter for screening mammogram for malignant neoplasm of breast: Secondary | ICD-10-CM

## 2014-01-22 ENCOUNTER — Emergency Department (HOSPITAL_BASED_OUTPATIENT_CLINIC_OR_DEPARTMENT_OTHER): Payer: Medicare Other

## 2014-01-22 ENCOUNTER — Encounter (HOSPITAL_BASED_OUTPATIENT_CLINIC_OR_DEPARTMENT_OTHER): Payer: Self-pay | Admitting: Emergency Medicine

## 2014-01-22 ENCOUNTER — Emergency Department (HOSPITAL_BASED_OUTPATIENT_CLINIC_OR_DEPARTMENT_OTHER)
Admission: EM | Admit: 2014-01-22 | Discharge: 2014-01-23 | Disposition: A | Discharge status: Home / Self Care | Payer: Medicare Other | Attending: Emergency Medicine | Admitting: Emergency Medicine

## 2014-01-22 ENCOUNTER — Ambulatory Visit: Payer: Medicaid Other

## 2014-01-22 DIAGNOSIS — G8929 Other chronic pain: Secondary | ICD-10-CM | POA: Insufficient documentation

## 2014-01-22 DIAGNOSIS — R109 Unspecified abdominal pain: Secondary | ICD-10-CM

## 2014-01-22 DIAGNOSIS — Z9089 Acquired absence of other organs: Secondary | ICD-10-CM | POA: Insufficient documentation

## 2014-01-22 DIAGNOSIS — Z8669 Personal history of other diseases of the nervous system and sense organs: Secondary | ICD-10-CM | POA: Insufficient documentation

## 2014-01-22 DIAGNOSIS — R1031 Right lower quadrant pain: Secondary | ICD-10-CM | POA: Insufficient documentation

## 2014-01-22 DIAGNOSIS — Z9889 Other specified postprocedural states: Secondary | ICD-10-CM | POA: Insufficient documentation

## 2014-01-22 DIAGNOSIS — R1011 Right upper quadrant pain: Secondary | ICD-10-CM | POA: Insufficient documentation

## 2014-01-22 DIAGNOSIS — Z90710 Acquired absence of both cervix and uterus: Secondary | ICD-10-CM | POA: Insufficient documentation

## 2014-01-22 DIAGNOSIS — I10 Essential (primary) hypertension: Secondary | ICD-10-CM | POA: Insufficient documentation

## 2014-01-22 DIAGNOSIS — R110 Nausea: Secondary | ICD-10-CM | POA: Insufficient documentation

## 2014-01-22 DIAGNOSIS — R1013 Epigastric pain: Secondary | ICD-10-CM | POA: Insufficient documentation

## 2014-01-22 LAB — COMPREHENSIVE METABOLIC PANEL
ALT: 24 U/L (ref 0–35)
AST: 24 U/L (ref 0–37)
Albumin: 4.3 g/dL (ref 3.5–5.2)
Alkaline Phosphatase: 78 U/L (ref 39–117)
BUN: 13 mg/dL (ref 6–23)
CO2: 28 mEq/L (ref 19–32)
Calcium: 9.6 mg/dL (ref 8.4–10.5)
Chloride: 103 mEq/L (ref 96–112)
Creatinine, Ser: 0.8 mg/dL (ref 0.50–1.10)
GFR calc non Af Amer: 76 mL/min — ABNORMAL LOW (ref >90)
GFR, EST AFRICAN AMERICAN: 88 mL/min — AB (ref >90)
GLUCOSE: 99 mg/dL (ref 70–99)
Potassium: 3.8 mEq/L (ref 3.7–5.3)
Sodium: 145 mEq/L (ref 137–147)
TOTAL PROTEIN: 8.1 g/dL (ref 6.0–8.3)

## 2014-01-22 LAB — URINALYSIS, ROUTINE W REFLEX MICROSCOPIC
Bilirubin Urine: NEGATIVE
Glucose, UA: NEGATIVE mg/dL
Ketones, ur: NEGATIVE mg/dL
LEUKOCYTES UA: NEGATIVE
NITRITE: NEGATIVE
Protein, ur: NEGATIVE mg/dL
SPECIFIC GRAVITY, URINE: 1.008 (ref 1.005–1.030)
UROBILINOGEN UA: 0.2 mg/dL (ref 0.0–1.0)
pH: 6.5 (ref 5.0–8.0)

## 2014-01-22 LAB — CBC WITH DIFFERENTIAL/PLATELET
BASOS ABS: 0 10*3/uL (ref 0.0–0.1)
Basophils Relative: 1 % (ref 0–1)
EOS PCT: 4 % (ref 0–5)
Eosinophils Absolute: 0.3 10*3/uL (ref 0.0–0.7)
HEMATOCRIT: 40.1 % (ref 36.0–46.0)
Hemoglobin: 12.9 g/dL (ref 12.0–15.0)
LYMPHS ABS: 2.5 10*3/uL (ref 0.7–4.0)
LYMPHS PCT: 36 % (ref 12–46)
MCH: 30.1 pg (ref 26.0–34.0)
MCHC: 32.2 g/dL (ref 30.0–36.0)
MCV: 93.5 fL (ref 78.0–100.0)
Monocytes Absolute: 0.6 10*3/uL (ref 0.1–1.0)
Monocytes Relative: 8 % (ref 3–12)
NEUTROS ABS: 3.6 10*3/uL (ref 1.7–7.7)
Neutrophils Relative %: 52 % (ref 43–77)
PLATELETS: 339 10*3/uL (ref 150–400)
RBC: 4.29 MIL/uL (ref 3.87–5.11)
RDW: 13.2 % (ref 11.5–15.5)
WBC: 7 10*3/uL (ref 4.0–10.5)

## 2014-01-22 LAB — LIPASE, BLOOD: LIPASE: 37 U/L (ref 11–59)

## 2014-01-22 LAB — URINE MICROSCOPIC-ADD ON

## 2014-01-22 MED ORDER — IOHEXOL 300 MG/ML  SOLN
100.0000 mL | Freq: Once | INTRAMUSCULAR | Status: AC | PRN
  Administered 2014-01-22: 100 mL via INTRAVENOUS

## 2014-01-22 MED ORDER — SODIUM CHLORIDE 0.9 % IV BOLUS (SEPSIS)
500.0000 mL | Freq: Once | INTRAVENOUS | Status: AC
  Administered 2014-01-22: 500 mL via INTRAVENOUS

## 2014-01-22 MED ORDER — ONDANSETRON HCL 4 MG/2ML IJ SOLN
4.0000 mg | Freq: Once | INTRAMUSCULAR | Status: AC
  Administered 2014-01-22: 4 mg via INTRAVENOUS
  Filled 2014-01-22: qty 2

## 2014-01-22 MED ORDER — HYDROMORPHONE HCL PF 1 MG/ML IJ SOLN
0.5000 mg | Freq: Once | INTRAMUSCULAR | Status: AC
  Administered 2014-01-22: 0.5 mg via INTRAVENOUS
  Filled 2014-01-22: qty 1

## 2014-01-22 MED ORDER — IOHEXOL 300 MG/ML  SOLN
50.0000 mL | Freq: Once | INTRAMUSCULAR | Status: AC | PRN
  Administered 2014-01-22: 50 mL via ORAL

## 2014-01-22 NOTE — ED Notes (Signed)
C/o RLQ pain and right flank pain x 2-3 years-daughter states "they can't find out what's wrong"

## 2014-01-22 NOTE — ED Notes (Signed)
MD at bedside. 

## 2014-01-22 NOTE — ED Provider Notes (Signed)
CSN: [id number]     Arrival date & time 01/22/14  2001 History  This chart was scribed for Junius Argyle, MD by Blanchard Kelch, ED Scribe. The patient was seen in room MH03/MH03. Patient's care was started at 9:42 PM.    Chief Complaint  Patient presents with  . Abdominal Pain     Patient is a 68 y.o. female presenting with abdominal pain. The history is provided by the patient. No language interpreter was used.  Abdominal Pain Pain location:  RLQ, epigastric and RUQ Pain radiates to:  R flank and back Duration:  104 weeks Timing:  Constant Progression:  Worsening Chronicity:  Chronic Context: not eating   Relieved by:  NSAIDs Associated symptoms: nausea   Associated symptoms: no constipation, no cough, no diarrhea, no fever, no hematuria and no vomiting     HPI Comments: LANAYSIA FRITCHMAN is a 68 y.o. female who presents to the Emergency Department complaining of constant centered to right sided abdominal pain that began about two years ago but worsened three days ago. The pain radiates around her right flank to her back. The daughter states that the pain occurs everyday and the pain is severe enough to cause her to cry. The patient states that she takes ibuprofen that mildly relieves the pain for about an hour but it always returns. The patient denies any aggravation of pain with eating. She reports intermittent associated nausea. She denies fever, dysuria, vomiting, diarrhea, vaginal bleeding or discharge, constipation or blood in stool. She has a past surgical history of appendectomy and hysterectomy. The patient has been seen by GI and by her PCP, Dr. Algie Coffer but she denies they have found a diagnosis for the pain.      Past Medical History  Diagnosis Date  . Hypertension   . Cerebral aneurysm    Past Surgical History  Procedure Laterality Date  . Appendectomy    . Abdominal hysterectomy     No family history on file. History  Substance Use Topics  . Smoking status:  Never Smoker   . Smokeless tobacco: Not on file  . Alcohol Use: No   OB History   Grav Para Term Preterm Abortions TAB SAB Ect Mult Living                 Review of Systems  Constitutional: Negative for fever.  HENT: Negative for drooling.   Eyes: Negative for discharge.  Respiratory: Negative for cough.   Cardiovascular: Negative for leg swelling.  Gastrointestinal: Positive for nausea and abdominal pain. Negative for vomiting, diarrhea, constipation and blood in stool.  Endocrine: Negative for polyuria.  Genitourinary: Negative for hematuria.  Musculoskeletal: Negative for gait problem.  Skin: Negative for rash.  Allergic/Immunologic: Negative for immunocompromised state.  Neurological: Negative for speech difficulty.  Hematological: Negative for adenopathy.  Psychiatric/Behavioral: Negative for confusion.  All other systems reviewed and are negative.      Allergies  Review of patient's allergies indicates no known allergies.  Home Medications   Current Outpatient Rx  Name  Route  Sig  Dispense  Refill  . acetaminophen (TYLENOL) 325 MG tablet   Oral   Take 650 mg by mouth every 6 (six) hours as needed for pain.         Marland Kitchen albuterol (PROVENTIL HFA;VENTOLIN HFA) 108 (90 BASE) MCG/ACT inhaler   Inhalation   Inhale 2 puffs into the lungs every 6 (six) hours as needed for wheezing.         Marland Kitchen  amLODipine (NORVASC) 5 MG tablet   Oral   Take 5 mg by mouth daily.         Marland Kitchen. lisinopril (PRINIVIL,ZESTRIL) 40 MG tablet   Oral   Take 40 mg by mouth daily.         . metoprolol tartrate (LOPRESSOR) 25 MG tablet   Oral   Take 25 mg by mouth 2 (two) times daily.         Marland Kitchen. omeprazole (PRILOSEC) 20 MG capsule   Oral   Take 20 mg by mouth daily.         . simvastatin (ZOCOR) 20 MG tablet   Oral   Take 20 mg by mouth every evening.         . zolpidem (AMBIEN) 5 MG tablet   Oral   Take 5 mg by mouth at bedtime as needed for sleep.          Triage Vitals:  BP 167/72  Pulse 79  Temp(Src) 98.8 F (37.1 C) (Oral)  Resp 20  Ht 5\' 1"  (1.549 m)  Wt 127 lb (57.607 kg)  BMI 24.01 kg/m2  SpO2 98%  Physical Exam  Nursing note and vitals reviewed. Constitutional: She is oriented to person, place, and time. She appears well-developed and well-nourished. No distress.  HENT:  Head: Normocephalic and atraumatic.  Mouth/Throat: Oropharynx is clear and moist. No oropharyngeal exudate.  Eyes: Conjunctivae and EOM are normal. Pupils are equal, round, and reactive to light.  Neck: Normal range of motion. Neck supple. No tracheal deviation present.  Cardiovascular: Normal rate and regular rhythm.  Exam reveals no gallop and no friction rub.   No murmur heard. Pulmonary/Chest: Effort normal and breath sounds normal. No respiratory distress. She has no wheezes. She has no rales.  Abdominal: Soft. Bowel sounds are normal. She exhibits no distension. There is tenderness. There is no rebound and no guarding.  Mild epigastric, RUQ and RLQ pain.   Musculoskeletal: Normal range of motion.  Lymphadenopathy:    She has no cervical adenopathy.  Neurological: She is alert and oriented to person, place, and time.  Skin: Skin is warm and dry.  Psychiatric: She has a normal mood and affect. Her behavior is normal.    ED Course  Procedures (including critical care time)  DIAGNOSTIC STUDIES: Oxygen Saturation is 98% on room air, normal by my interpretation.    COORDINATION OF CARE: 9:50 PM -Will order CT abdomen with contrast and labs.  Patient verbalizes understanding and agrees with treatment plan.    Labs Review Labs Reviewed  URINALYSIS, ROUTINE W REFLEX MICROSCOPIC - Abnormal; Notable for the following:    Hgb urine dipstick MODERATE (*)    All other components within normal limits  COMPREHENSIVE METABOLIC PANEL - Abnormal; Notable for the following:    Total Bilirubin <0.2 (*)    GFR calc non Af Amer 76 (*)    GFR calc Af Amer 88 (*)    All other  components within normal limits  URINE MICROSCOPIC-ADD ON  CBC WITH DIFFERENTIAL  LIPASE, BLOOD   Imaging Review Ct Abdomen Pelvis W Contrast  01/22/2014   CLINICAL DATA:  Chronic intermittent right-sided abdominal pain and nausea which began again approximately 3 days ago with progressive worsening, radiating into the right flank. Surgical history includes appendectomy and hysterectomy.  EXAM: CT ABDOMEN AND PELVIS WITH CONTRAST  TECHNIQUE: Multidetector CT imaging of the abdomen and pelvis was performed using the standard protocol following bolus administration of intravenous contrast.  CONTRAST:  OMNIPAQUE IOHEXOL 300 MG/ML IV. Oral contrast was also administered.  COMPARISON:  CT abdomen and pelvis 01/17/2012, 03/11/2010.  FINDINGS: Normal-appearing liver, spleen, pancreas, and adrenal glands. Bilateral parapelvic renal cysts; no significant abnormalities involving either kidney. Gallbladder contracted as there is a large amount of food in the stomach, but no pericholecystic inflammation. No biliary ductal dilation. Mild aortoiliac atherosclerosis. No significant lymphadenopathy.  Normal-appearing stomach, filled with food. Normal-appearing small bowel. Entire colon relatively decompressed and unremarkable. No ascites. Appendix surgically absent.  Urinary bladder decompressed and unremarkable. Uterus surgically absent. No adnexal masses or free pelvic fluid. Numerous pelvic phleboliths.  Bone window images demonstrate facet degenerative changes involving the lower lumbar spine. Visualized lung bases clear. Heart size normal.  IMPRESSION: 1. No acute or significant abnormalities involving the abdomen or pelvis. 2. Parapelvic cysts involving both kidneys. 3. Mild aorto-iliac atherosclerosis.   Electronically Signed   By: Hulan Saas M.D.   On: 01/22/2014 23:32     EKG Interpretation None      MDM   Final diagnoses:  Chronic abdominal pain    11:24 PM 68 y.o. female with a history of  chronic abdominal pain presents with acute worsening of her pain in the last 3 days. Abdomen is soft with tenderness to palpation in the epigastric, right upper quadrant, and right lower quadrant. The patient has had an extensive workup in the past including colonoscopy, CT, ultrasound, pelvic exam. I discussed options with the family. Will get screening labs and imaging of the patient's abdomen although pain seems to be consistent with her chronic abdominal pain. Pain control w/ dilaudid.   12:24 AM: Pt became nauseous w/ dilaudid. I interpreted/reviewed the labs and/or imaging which were non-contributory. Pt's pain has improved. Will provide Rx for 800mg  ibuprofen at daughters request. Pt does seem to be sensitive to narcotics, will provide percocet, but recommend pt only take 1/2 tablet.  I have discussed the diagnosis/risks/treatment options with the patient and family and believe the pt to be eligible for discharge home to follow-up with pcp next week. We also discussed returning to the ED immediately if new or worsening sx occur. We discussed the sx which are most concerning (e.g., worsening pain, inability to tolerate po, fever) that necessitate immediate return. Medications administered to the patient during their visit and any new prescriptions provided to the patient are listed below.  Medications given during this visit Medications  iohexol (OMNIPAQUE) 300 MG/ML solution 50 mL (50 mLs Oral Contrast Given 01/22/14 2203)  iohexol (OMNIPAQUE) 300 MG/ML solution 100 mL (100 mLs Intravenous Contrast Given 01/22/14 2252)  HYDROmorphone (DILAUDID) injection 0.5 mg (0.5 mg Intravenous Given 01/22/14 2312)  ondansetron (ZOFRAN) injection 4 mg (4 mg Intravenous Given 01/22/14 2340)  sodium chloride 0.9 % bolus 500 mL (500 mLs Intravenous New Bag/Given 01/22/14 2340)  promethazine (PHENERGAN) injection 12.5 mg (12.5 mg Intravenous Given 01/23/14 0004)    New Prescriptions   IBUPROFEN (ADVIL,MOTRIN) 800 MG  TABLET    Take 1 tablet (800 mg total) by mouth 3 (three) times daily.   ONDANSETRON (ZOFRAN ODT) 4 MG DISINTEGRATING TABLET    4mg  ODT q4 hours prn nausea/vomit   OXYCODONE-ACETAMINOPHEN (PERCOCET) 5-325 MG PER TABLET    Take 0.5 tablets by mouth every 8 (eight) hours as needed for moderate pain.       I personally performed the services described in this documentation, which was scribed in my presence. The recorded information has been reviewed and is accurate.  Junius Argyle, MD 01/23/14 [phone]

## 2014-01-23 DIAGNOSIS — G8929 Other chronic pain: Secondary | ICD-10-CM | POA: Diagnosis present

## 2014-01-23 DIAGNOSIS — R109 Unspecified abdominal pain: Secondary | ICD-10-CM

## 2014-01-23 MED ORDER — OXYCODONE-ACETAMINOPHEN 5-325 MG PO TABS: 0.5000 | ORAL_TABLET | Freq: Three times a day (TID) | ORAL | Status: DC | PRN

## 2014-01-23 MED ORDER — IBUPROFEN 800 MG PO TABS: 800.0000 mg | ORAL_TABLET | Freq: Three times a day (TID) | ORAL | Status: DC

## 2014-01-23 MED ORDER — IBUPROFEN 400 MG PO TABS: 600.0000 mg | ORAL_TABLET | Freq: Once | ORAL | Status: DC

## 2014-01-23 MED ORDER — ONDANSETRON 4 MG PO TBDP: ORAL_TABLET | ORAL | Status: DC

## 2014-01-23 MED ORDER — PROMETHAZINE HCL 25 MG/ML IJ SOLN
12.5000 mg | Freq: Once | INTRAMUSCULAR | Status: AC
  Administered 2014-01-23: 12.5 mg via INTRAVENOUS
  Filled 2014-01-23: qty 1

## 2014-02-01 ENCOUNTER — Ambulatory Visit
Admission: RE | Admit: 2014-02-01 | Discharge: 2014-02-01 | Disposition: A | Discharge status: Home / Self Care | Payer: Medicaid Other | Source: Ambulatory Visit | Attending: Cardiovascular Disease | Admitting: Cardiovascular Disease

## 2014-02-01 DIAGNOSIS — Z1231 Encounter for screening mammogram for malignant neoplasm of breast: Secondary | ICD-10-CM

## 2014-02-09 ENCOUNTER — Encounter (INDEPENDENT_AMBULATORY_CARE_PROVIDER_SITE_OTHER): Payer: Self-pay | Admitting: General Surgery

## 2014-02-09 ENCOUNTER — Ambulatory Visit (INDEPENDENT_AMBULATORY_CARE_PROVIDER_SITE_OTHER): Payer: Medicare Other | Admitting: General Surgery

## 2014-02-09 VITALS — BP 150/70 | HR 80 | Temp 97.5°F | Resp 14 | Ht 61.5 in | Wt 130.0 lb

## 2014-02-09 DIAGNOSIS — K6289 Other specified diseases of anus and rectum: Secondary | ICD-10-CM

## 2014-02-09 MED ORDER — HYDROCORTISONE 2.5 % RE CREA: 1.0000 "application " | TOPICAL_CREAM | Freq: Two times a day (BID) | RECTAL | Status: DC

## 2014-02-09 NOTE — Progress Notes (Signed)
Chief Complaint  Patient presents with  . New Evaluation    eval hemms    HISTORY: Natalie Willis is a 68 y.o. female who presents to the office with anal pain.  Other symptoms include diarrhea.  This had been occurring for several years.  She has tried otc creams and suppositories in the past with some success.  Certain foods she eats makes the symptoms worse.   It is continuous in nature.  Her bowel habits are 5-7 BM's a day and her bowel movements are usually loose.  Her fiber intake is dietary.  Her last colonoscopy was last year.  It was normal per pt.  Done at [facility].  She does endorse prolapsing tissue.     Past Medical History  Diagnosis Date  . Hypertension   . Cerebral aneurysm       Past Surgical History  Procedure Laterality Date  . Appendectomy    . Abdominal hysterectomy          Current Outpatient Prescriptions  Medication Sig Dispense Refill  . acetaminophen (TYLENOL) 325 MG tablet Take 650 mg by mouth every 6 (six) hours as needed for pain.      Marland Kitchen albuterol (PROVENTIL HFA;VENTOLIN HFA) 108 (90 BASE) MCG/ACT inhaler Inhale 2 puffs into the lungs every 6 (six) hours as needed for wheezing.      Marland Kitchen amLODipine (NORVASC) 5 MG tablet Take 5 mg by mouth daily.      Marland Kitchen ibuprofen (ADVIL,MOTRIN) 800 MG tablet Take 1 tablet (800 mg total) by mouth 3 (three) times daily.  30 tablet  0  . lisinopril (PRINIVIL,ZESTRIL) 40 MG tablet Take 40 mg by mouth daily.      . metoprolol tartrate (LOPRESSOR) 25 MG tablet Take 25 mg by mouth 2 (two) times daily.      Marland Kitchen omeprazole (PRILOSEC) 20 MG capsule Take 20 mg by mouth daily.      . ondansetron (ZOFRAN ODT) 4 MG disintegrating tablet 4mg  ODT q4 hours prn nausea/vomit  20 tablet  0  . simvastatin (ZOCOR) 20 MG tablet Take 20 mg by mouth every evening.      Marland Kitchen amoxicillin (AMOXIL) 500 MG capsule       . hydrocortisone (PROCTOZONE-HC) 2.5 % rectal cream Place 1 application rectally 2 (two) times daily.  30 g  1   No current  facility-administered medications for this visit.      Allergies  Allergen Reactions  . Dilaudid [Hydromorphone Hcl] Nausea And Vomiting    Not an allergy, but pt appears sensitive to IV narcotics.       History reviewed. No pertinent family history.  History   Social History  . Marital Status: Legally Separated    Spouse Name: N/A    Number of Children: N/A  . Years of Education: N/A   Social History Main Topics  . Smoking status: Never Smoker   . Smokeless tobacco: None  . Alcohol Use: No  . Drug Use: No  . Sexual Activity: None   Other Topics Concern  . None   Social History Narrative  . None      REVIEW OF SYSTEMS - PERTINENT POSITIVES ONLY: Review of Systems - General ROS: negative for - chills, fever or weight loss Hematological and Lymphatic ROS: negative for - bleeding problems, blood clots or bruising Respiratory ROS: no cough, shortness of breath, or wheezing Cardiovascular ROS: no chest pain or dyspnea on exertion Gastrointestinal ROS: positive for - abdominal pain and diarrhea Genito-Urinary  ROS: no dysuria, trouble voiding, or hematuria  EXAM: Filed Vitals:   02/09/14 0852  BP: 150/70  Pulse: 80  Temp: 97.5 F (36.4 C)  Resp: 14    General appearance: alert and cooperative Resp: clear to auscultation bilaterally Cardio: regular rate and rhythm GI: normal findings: soft, non-tender   Procedure: Anoscopy Surgeon: Maisie Fushomas Diagnosis: anal pain  Assistant: Eldridge ScotKendrick, Glenda After the risks and benefits were explained, verbal consent was obtained for above procedure  Anesthesia: none Findings: no external hemorrhoids noted, grade 1 RA and RP internal hemorrhoids, grade 2 LL internal hemorrhoid with mild inflammation of anal canal     ASSESSMENT AND PLAN:  Natalie Willis is a 68 y.o. F Who presents to the office with chronic diarrhea, chronic abdominal pain and chronic anal pain. It appears she has had a complete GI workup including  colonoscopy one year ago, which was normal.  She denies any rectal bleeding. On physical exam she has a mild amount of inflammation in her anal canal and a grade 2 left lateral internal hemorrhoid. I do not see any signs of other disease that could be contributing to her anal pain. I suspect that her chronic diarrhea is contributing to her anal pain and have recommended that she start a fiber supplement to help regulate her bowel movements better. I will see her back in the office in 3 months.  I will request her records fro [facility] as well.   Vanita PandaAlicia C Edgard Debord, MD Colon and Rectal Surgery / General Surgery [facility]      Visit Diagnoses: 1. Anal pain     Primary Care Physician: Ricki RodriguezKADAKIA,AJAY S, MD

## 2014-02-09 NOTE — Patient Instructions (Addendum)
GETTING TO GOOD BOWEL HEALTH. Irregular bowel habits such as diarrhea can lead to many problems over time.  Having one soft, formed bowel movement a day is the most important way to prevent further problems.  The anorectal canal is designed to handle stretching and feces to safely manage our ability to get rid of solid waste (feces, poop, stool) out of our body.  BUT, diarrhea can be a burning fire to this very sensitive area of our body, causing inflamed hemorrhoids, anal fissures, increasing risk is perirectal abscesses, abdominal pain and bloating.     The goal: ONE SOFT BOWEL MOVEMENT A DAY!  To have soft, regular bowel movements:    Drink at least 8 tall glasses of water a day.     Take plenty of fiber.  Fiber is the undigested part of plant food that passes into the colon, acting s "natures broom" to encourage bowel motility and movement.  Fiber can absorb and hold large amounts of water. This results in a larger, bulkier stool, which is soft and easier to pass. Work gradually over several weeks up to 6 servings a day of fiber (25g a day even more if needed) in the form of: o Vegetables -- Root (potatoes, carrots, turnips), leafy green (lettuce, salad greens, celery, spinach), or cooked high residue (cabbage, broccoli, etc) o Fruit -- Fresh (unpeeled skin & pulp, bananas), Dried (prunes, apricots, raisons, cherries, etc ),  or stewed ( applesauce)  o Whole grain breads, pasta, etc (whole wheat)  o Bran cereals    Bulking Agents -- This type of water-retaining fiber generally is easily obtained each day by one of the following:  o Methylcellulose -- This is another fiber derived from wood which also retains water. It is available as Citrucel and Benefiber and FiberCon o Flax Seed - a less gassy fiber than psyllium   Controlling diarrhea o Switch to liquids and simpler foods for a few days to avoid stressing your intestines further. o Avoid dairy products (especially milk & ice cream) for a short  time.  The intestines often can lose the ability to digest lactose when stressed. o Avoid foods that cause gassiness or bloating.  Typical foods include beans and other legumes, cabbage, broccoli, and dairy foods.  Every person has some sensitivity to other foods, so listen to our body and avoid those foods that trigger problems for you. o Adding fiber (Citrucel, Metamucil, psyllium, Miralax) gradually can help thicken stools by absorbing excess fluid and retrain the intestines to act more normally.  Slowly increase the dose over a few weeks.  Too much fiber too soon can backfire and cause cramping & bloating. o Probiotics (such as active yogurt, Align, etc) may help repopulate the intestines and colon with normal bacteria and calm down a sensitive digestive tract.  Most studies show it to be of mild help, though, and such products can be costly. o Medicines:   Bismuth subsalicylate (ex. Kayopectate, Pepto Bismol) every 30 minutes for up to 6 doses can help control diarrhea.  Avoid if pregnant.   Loperamide (Immodium) can slow down diarrhea.  Start with two tablets (4mg  total) first and then try one tablet every 6 hours.  Avoid if you are having fevers or severe pain.  If you are not better or start feeling worse, stop all medicines and call your doctor for advice o Call your doctor if you are getting worse or not better.  Sometimes further testing (cultures, endoscopy, X-ray studies, bloodwork, etc) may  be needed to help diagnose and treat the cause of the diarrhea.   Fiber Chart  You should 25-30g of fiber per day and drinking 8 glasses of water to help your bowels move regularly.  In the chart below you can look up how much fiber you are getting in an average day.  If you are not getting enough fiber, you should add a fiber supplement to your diet.  Examples of this include Metamucil, FiberCon and Citrucel.  These can be purchased at your local grocery store or pharmacy.       [email or web address]

## 2014-02-16 ENCOUNTER — Emergency Department (HOSPITAL_COMMUNITY): Payer: Medicare Other

## 2014-02-16 ENCOUNTER — Encounter (HOSPITAL_COMMUNITY): Payer: Self-pay | Admitting: Emergency Medicine

## 2014-02-16 ENCOUNTER — Emergency Department (HOSPITAL_COMMUNITY)
Admission: EM | Admit: 2014-02-16 | Discharge: 2014-02-16 | Disposition: A | Discharge status: Home / Self Care | Payer: Medicare Other | Attending: Emergency Medicine | Admitting: Emergency Medicine

## 2014-02-16 DIAGNOSIS — Z9089 Acquired absence of other organs: Secondary | ICD-10-CM | POA: Insufficient documentation

## 2014-02-16 DIAGNOSIS — Z791 Long term (current) use of non-steroidal anti-inflammatories (NSAID): Secondary | ICD-10-CM | POA: Insufficient documentation

## 2014-02-16 DIAGNOSIS — Z90710 Acquired absence of both cervix and uterus: Secondary | ICD-10-CM | POA: Insufficient documentation

## 2014-02-16 DIAGNOSIS — I10 Essential (primary) hypertension: Secondary | ICD-10-CM | POA: Insufficient documentation

## 2014-02-16 DIAGNOSIS — G8929 Other chronic pain: Secondary | ICD-10-CM | POA: Insufficient documentation

## 2014-02-16 DIAGNOSIS — IMO0002 Reserved for concepts with insufficient information to code with codable children: Secondary | ICD-10-CM | POA: Insufficient documentation

## 2014-02-16 DIAGNOSIS — Z79899 Other long term (current) drug therapy: Secondary | ICD-10-CM | POA: Insufficient documentation

## 2014-02-16 DIAGNOSIS — N201 Calculus of ureter: Secondary | ICD-10-CM | POA: Insufficient documentation

## 2014-02-16 HISTORY — DX: Hematuria, unspecified: R31.9

## 2014-02-16 LAB — URINALYSIS, ROUTINE W REFLEX MICROSCOPIC
Bilirubin Urine: NEGATIVE
Glucose, UA: NEGATIVE mg/dL
KETONES UR: NEGATIVE mg/dL
Leukocytes, UA: NEGATIVE
NITRITE: NEGATIVE
PROTEIN: NEGATIVE mg/dL
Specific Gravity, Urine: 1.013 (ref 1.005–1.030)
Urobilinogen, UA: 0.2 mg/dL (ref 0.0–1.0)
pH: 7 (ref 5.0–8.0)

## 2014-02-16 LAB — COMPREHENSIVE METABOLIC PANEL
ALT: 15 U/L (ref 0–35)
AST: 19 U/L (ref 0–37)
Albumin: 4.4 g/dL (ref 3.5–5.2)
Alkaline Phosphatase: 75 U/L (ref 39–117)
BILIRUBIN TOTAL: 0.2 mg/dL — AB (ref 0.3–1.2)
BUN: 13 mg/dL (ref 6–23)
CO2: 24 mEq/L (ref 19–32)
Calcium: 9.3 mg/dL (ref 8.4–10.5)
Chloride: 102 mEq/L (ref 96–112)
Creatinine, Ser: 0.9 mg/dL (ref 0.50–1.10)
GFR calc Af Amer: 76 mL/min — ABNORMAL LOW (ref >90)
GFR calc non Af Amer: 66 mL/min — ABNORMAL LOW (ref >90)
Glucose, Bld: 137 mg/dL — ABNORMAL HIGH (ref 70–99)
Potassium: 4.2 mEq/L (ref 3.7–5.3)
SODIUM: 141 meq/L (ref 137–147)
TOTAL PROTEIN: 7.7 g/dL (ref 6.0–8.3)

## 2014-02-16 LAB — CBC WITH DIFFERENTIAL/PLATELET
BASOS ABS: 0 10*3/uL (ref 0.0–0.1)
Basophils Relative: 0 % (ref 0–1)
EOS ABS: 0 10*3/uL (ref 0.0–0.7)
EOS PCT: 0 % (ref 0–5)
HEMATOCRIT: 38.6 % (ref 36.0–46.0)
Hemoglobin: 12.7 g/dL (ref 12.0–15.0)
LYMPHS PCT: 11 % — AB (ref 12–46)
Lymphs Abs: 1.4 10*3/uL (ref 0.7–4.0)
MCH: 30.2 pg (ref 26.0–34.0)
MCHC: 32.9 g/dL (ref 30.0–36.0)
MCV: 91.9 fL (ref 78.0–100.0)
MONO ABS: 0.5 10*3/uL (ref 0.1–1.0)
Monocytes Relative: 4 % (ref 3–12)
Neutro Abs: 11.1 10*3/uL — ABNORMAL HIGH (ref 1.7–7.7)
Neutrophils Relative %: 85 % — ABNORMAL HIGH (ref 43–77)
PLATELETS: 300 10*3/uL (ref 150–400)
RBC: 4.2 MIL/uL (ref 3.87–5.11)
RDW: 13.6 % (ref 11.5–15.5)
WBC: 13.1 10*3/uL — AB (ref 4.0–10.5)

## 2014-02-16 LAB — URINE MICROSCOPIC-ADD ON

## 2014-02-16 LAB — LIPASE, BLOOD: LIPASE: 35 U/L (ref 11–59)

## 2014-02-16 MED ORDER — FENTANYL CITRATE 0.05 MG/ML IJ SOLN
25.0000 ug | Freq: Once | INTRAMUSCULAR | Status: AC
  Administered 2014-02-16: 25 ug via INTRAVENOUS
  Filled 2014-02-16: qty 2

## 2014-02-16 MED ORDER — MORPHINE SULFATE 2 MG/ML IJ SOLN
2.0000 mg | Freq: Once | INTRAMUSCULAR | Status: AC
  Administered 2014-02-16: 2 mg via INTRAVENOUS
  Filled 2014-02-16: qty 1

## 2014-02-16 MED ORDER — PROMETHAZINE HCL 25 MG/ML IJ SOLN
12.5000 mg | Freq: Four times a day (QID) | INTRAMUSCULAR | Status: DC | PRN
  Administered 2014-02-16: 12.5 mg via INTRAVENOUS
  Filled 2014-02-16: qty 1

## 2014-02-16 MED ORDER — PROMETHAZINE HCL 25 MG PO TABS: 25.0000 mg | ORAL_TABLET | Freq: Four times a day (QID) | ORAL | Status: DC | PRN

## 2014-02-16 MED ORDER — IOHEXOL 300 MG/ML  SOLN
80.0000 mL | Freq: Once | INTRAMUSCULAR | Status: AC | PRN
Start: 2014-02-16 — End: 2014-02-16
  Administered 2014-02-16: 80 mL via INTRAVENOUS

## 2014-02-16 MED ORDER — ONDANSETRON HCL 4 MG/2ML IJ SOLN
4.0000 mg | Freq: Once | INTRAMUSCULAR | Status: AC
  Administered 2014-02-16: 4 mg via INTRAVENOUS
  Filled 2014-02-16: qty 2

## 2014-02-16 MED ORDER — IOHEXOL 300 MG/ML  SOLN
25.0000 mL | Freq: Once | INTRAMUSCULAR | Status: AC | PRN
  Administered 2014-02-16: 25 mL via ORAL

## 2014-02-16 MED ORDER — SODIUM CHLORIDE 0.9 % IV BOLUS (SEPSIS)
250.0000 mL | Freq: Once | INTRAVENOUS | Status: AC
  Administered 2014-02-16: 250 mL via INTRAVENOUS

## 2014-02-16 MED ORDER — SODIUM CHLORIDE 0.9 % IV SOLN
INTRAVENOUS | Status: DC
Start: 2014-02-16 — End: 2014-02-16
  Administered 2014-02-16 (×2): via INTRAVENOUS

## 2014-02-16 MED ORDER — OXYCODONE-ACETAMINOPHEN 5-325 MG PO TABS: 1.0000 | ORAL_TABLET | Freq: Four times a day (QID) | ORAL | Status: DC | PRN

## 2014-02-16 NOTE — ED Notes (Signed)
MD at bedside. 

## 2014-02-16 NOTE — ED Notes (Signed)
Communication with CT, pt. Finished with contrast

## 2014-02-16 NOTE — ED Notes (Signed)
MD notified of no relief with pain meds

## 2014-02-16 NOTE — ED Notes (Signed)
MD notified of existing condition

## 2014-02-16 NOTE — ED Notes (Signed)
Pt states she is unable to give urine at this time 

## 2014-02-16 NOTE — ED Notes (Signed)
Pt reports sudden onset this am of right side abd pain and right side back pain. Having n/v. Last bm was yesterday and normal per pt. Has urinated this am and denies any pain or problems urinating.

## 2014-02-16 NOTE — Discharge Instructions (Signed)
Clculos renales (Kidney Stones) Los clculos renales (urolitiasis) son masas slidas que se forman en el interior de los riones. El dolor intenso es causado por el movimiento de la piedra a travs del rin, urter, vejiga y Advertising account executiveuretra (tracto urinario). Cuando la piedra se mueve, el urter hace un espasmo alrededor de la misma. El clculo generalmente se elimina con el pis (la [place]).  CUIDADOS EN EL HOGAR  Debe ingerir gran cantidad de lquido para mantener la orina de tono claro o color amarillo plido. Esto ayudar a eliminar la piedra.  Cuele la orina con el colador que le han provisto. Noorine de otra forma que no sea a travs del Engineer, manufacturing systemscolador, ni siquiera una vez. Si elimina la piedra, se retendr en el colador. Puede ser tan pequea como un grano de sal. Llvela a su visita con el mdico. Esto ayudar a que el mdico le indique qu puede hacer para tratar de prevenir la ocurrencia de nuevos clculos renales.  Slo tome los medicamentos que le haya indicado su mdico.  Concurra a las consultas de control con el mdico, segn las indicaciones.  Hgase las radiografas segn las indicaciones de su mdico. SOLICITE AYUDA SI: Siente un dolor que empeora an tomando analgsicos. SOLICITE AYUDA DE INMEDIATO SI:   El dolor no mejora con los medicamentos recetados.  Siente escalofros o fiebre.  El dolor aumenta y Insurance underwriterempeora en las siguientes 18 horas.  Siente un nuevo dolor en el vientre (abdominal).  Sufre mareos o se desmaya.  Nota que no puede orinar. ASEGRESE DE QUE:   Comprende estas instrucciones.  Controlar su afeccin.  Recibir ayuda de inmediato si no mejora o si empeora. Document Released: 01/18/2009 Document Revised: 06/24/2013 [facility] Patient Information 2014 [place], [place].  Clico ureteral (Ureteral Colic) El clico ureteral es un dolor similar a un espasmo en el rin o en el conducto por el cual drena el rin (urter) Generalmente la causa es un clculo renal. La  causa del dolor es un clculo que trata de Engineer, agriculturalatravesar los conductos por los que se elimina la Rentzorina.  CUIDADOS EN EL HOGAR  Beba la cantidad suficiente de agua como para mantener la orina de color claro o amarillo plido.  Cuele la [place]. Le proporcionarn un colador. Guarde todo lo que Public librarianrecoja en el colador y ArvinMeritorllvelo a su mdico. El clculo que causa el dolor puede ser Meadmuy pequeo.  Tome los medicamentos segn las indicaciones de su mdico.  Cumpla con las citas de control tal como le indic el mdico. SOLICITE AYUDA DE INMEDIATO SI:  No consigue aliviar el dolor con la medicacin.  El dolor contina o Gilbertempeora.  El dolor se modifica y comienza a Financial risk analystsentir dolor en el pecho o en el abdomen.  Se marea o pierde el conocimiento.  No puede orinar.  Vomita repetidamente.  Usted tiene una temperatura oral de ms de 38,9 C (102 F) y no puede controlarla con medicamentos. ASEGRESE DE QUE:   Comprende estas instrucciones.  Controlar su enfermedad.  Solicitar ayuda inmediatamente si no mejora o si empeora. Document Released: 01/18/2009 Document Revised: 01/14/2012 [facility] Patient Information 2014 [place], [place].   Make an appointment to followup with urology. Take pain medication as directed. Return for a newer worse symptoms. He will most likely pass the stone sometime in the next 24 hours.

## 2014-02-24 ENCOUNTER — Encounter (INDEPENDENT_AMBULATORY_CARE_PROVIDER_SITE_OTHER): Payer: Self-pay

## 2014-02-26 NOTE — ED Provider Notes (Addendum)
CSN: [id number]     Arrival date & time 02/16/14  0759 History   First MD Initiated Contact with Patient 02/16/14 [phone]     Chief Complaint  Patient presents with  . Abdominal Pain  . Flank Pain     (Consider location/radiation/quality/duration/timing/severity/associated sxs/prior Treatment) Patient is a 68 y.o. female presenting with abdominal pain and flank pain. The history is provided by the patient.  Abdominal Pain Associated symptoms: nausea and vomiting   Associated symptoms: no chest pain, no dysuria, no fever, no hematuria and no shortness of breath   Flank Pain Associated symptoms include abdominal pain. Pertinent negatives include no chest pain, no headaches and no shortness of breath.   sudden onset of right-sided abdominal pain and right-sided back pain associated with nausea and vomiting that started this morning. Patient denies any to sure he or hematuria. Patient denies any history of similar pain. Pain is 10 out of 10. Patient's past medical history is negative for kidney stones. Patient has had her appendix removed and abdominal hysterectomy. Pain is sharp in nature nothing makes it better nothing makes it worse.  Onset of pain was at 6:00 in the morning. Patient does have a history of chronic pain problems.   Past Medical History  Diagnosis Date  . Hypertension   . Cerebral aneurysm   . Hematuria    Past Surgical History  Procedure Laterality Date  . Appendectomy    . Abdominal hysterectomy     History reviewed. No pertinent family history. History  Substance Use Topics  . Smoking status: Never Smoker   . Smokeless tobacco: Not on file  . Alcohol Use: No   OB History   Grav Para Term Preterm Abortions TAB SAB Ect Mult Living                 Review of Systems  Constitutional: Negative for fever.  HENT: Negative for congestion.   Eyes: Negative for redness.  Respiratory: Negative for shortness of breath.   Cardiovascular: Negative for chest pain.   Gastrointestinal: Positive for nausea, vomiting and abdominal pain.  Genitourinary: Positive for flank pain. Negative for dysuria and hematuria.  Musculoskeletal: Positive for back pain.  Skin: Negative for rash.  Neurological: Negative for headaches.  Hematological: Does not bruise/bleed easily.  Psychiatric/Behavioral: Negative for confusion.      Allergies  Dilaudid  Home Medications   Prior to Admission medications   Medication Sig Start Date End Date Taking? Authorizing Provider  albuterol (PROVENTIL HFA;VENTOLIN HFA) 108 (90 BASE) MCG/ACT inhaler Inhale 2 puffs into the lungs every 6 (six) hours as needed for wheezing.   Yes Historical Provider, MD  amLODipine (NORVASC) 5 MG tablet Take 5 mg by mouth daily.   Yes Historical Provider, MD  hydrocortisone (PROCTOZONE-HC) 2.5 % rectal cream Place 1 application rectally 2 (two) times daily. 02/09/14  Yes Romie Levee, MD  ibuprofen (ADVIL,MOTRIN) 800 MG tablet Take 1 tablet (800 mg total) by mouth 3 (three) times daily. 01/23/14  Yes Junius Argyle, MD  lisinopril (PRINIVIL,ZESTRIL) 40 MG tablet Take 40 mg by mouth daily.   Yes Historical Provider, MD  metoprolol tartrate (LOPRESSOR) 25 MG tablet Take 12.5 mg by mouth daily.    Yes Historical Provider, MD  Multiple Vitamins-Minerals (MULTIVITAMIN PO) Take 1 tablet by mouth daily.   Yes Historical Provider, MD  omeprazole (PRILOSEC) 20 MG capsule Take 20 mg by mouth daily.   Yes Historical Provider, MD  simvastatin (ZOCOR) 20 MG tablet Take 20 mg  by mouth every evening.   Yes Historical Provider, MD  zolpidem (AMBIEN) 5 MG tablet Take 2.5-5 mg by mouth See admin instructions. Take 2.5 mg by mouth at bedtime as needed for sleep, may take an additional 2.5 mg as needed.   Yes Historical Provider, MD  oxyCODONE-acetaminophen (PERCOCET/ROXICET) 5-325 MG per tablet Take 1-2 tablets by mouth every 6 (six) hours as needed for severe pain. 02/16/14   Shelda Jakes, MD  promethazine  (PHENERGAN) 25 MG tablet Take 1 tablet (25 mg total) by mouth every 6 (six) hours as needed for nausea or vomiting. 02/16/14   Shelda Jakes, MD   BP 117/62  Pulse 74  Temp(Src) 98 F (36.7 C) (Oral)  Resp 18  SpO2 93% Physical Exam  Nursing note and vitals reviewed. Constitutional: She is oriented to person, place, and time. She appears well-developed and well-nourished. She appears distressed.  HENT:  Head: Normocephalic and atraumatic.  Mouth/Throat: Oropharynx is clear and moist.  Eyes: Conjunctivae and EOM are normal. Pupils are equal, round, and reactive to light.  Neck: Normal range of motion.  Cardiovascular: Normal rate, regular rhythm and normal heart sounds.   Pulmonary/Chest: Effort normal and breath sounds normal. No respiratory distress.  Abdominal: Soft. Bowel sounds are normal. There is no tenderness.  Musculoskeletal: Normal range of motion.  Neurological: She is alert and oriented to person, place, and time. No cranial nerve deficit. She exhibits normal muscle tone. Coordination normal.  Skin: Skin is warm. No rash noted.    ED Course  Procedures (including critical care time) Labs Review Labs Reviewed  COMPREHENSIVE METABOLIC PANEL - Abnormal; Notable for the following:    Glucose, Bld 137 (*)    Total Bilirubin 0.2 (*)    GFR calc non Af Amer 66 (*)    GFR calc Af Amer 76 (*)    All other components within normal limits  CBC WITH DIFFERENTIAL - Abnormal; Notable for the following:    WBC 13.1 (*)    Neutrophils Relative % 85 (*)    Neutro Abs 11.1 (*)    Lymphocytes Relative 11 (*)    All other components within normal limits  URINALYSIS, ROUTINE W REFLEX MICROSCOPIC - Abnormal; Notable for the following:    Hgb urine dipstick MODERATE (*)    All other components within normal limits  URINE MICROSCOPIC-ADD ON - Abnormal; Notable for the following:    Bacteria, UA FEW (*)    All other components within normal limits  LIPASE, BLOOD   Results for  orders placed during the hospital encounter of 02/16/14  LIPASE, BLOOD      Result Value Ref Range   Lipase 35  11 - 59 U/L  COMPREHENSIVE METABOLIC PANEL      Result Value Ref Range   Sodium 141  137 - 147 mEq/L   Potassium 4.2  3.7 - 5.3 mEq/L   Chloride 102  96 - 112 mEq/L   CO2 24  19 - 32 mEq/L   Glucose, Bld 137 (*) 70 - 99 mg/dL   BUN 13  6 - 23 mg/dL   Creatinine, Ser 1.61  0.50 - 1.10 mg/dL   Calcium 9.3  8.4 - 09.6 mg/dL   Total Protein 7.7  6.0 - 8.3 g/dL   Albumin 4.4  3.5 - 5.2 g/dL   AST 19  0 - 37 U/L   ALT 15  0 - 35 U/L   Alkaline Phosphatase 75  39 - 117 U/L  Total Bilirubin 0.2 (*) 0.3 - 1.2 mg/dL   GFR calc non Af Amer 66 (*) >90 mL/min   GFR calc Af Amer 76 (*) >90 mL/min  CBC WITH DIFFERENTIAL      Result Value Ref Range   WBC 13.1 (*) 4.0 - 10.5 K/uL   RBC 4.20  3.87 - 5.11 MIL/uL   Hemoglobin 12.7  12.0 - 15.0 g/dL   HCT 69.638.6  29.536.0 - 28.446.0 %   MCV 91.9  78.0 - 100.0 fL   MCH 30.2  26.0 - 34.0 pg   MCHC 32.9  30.0 - 36.0 g/dL   RDW 13.213.6  44.011.5 - 10.215.5 %   Platelets 300  150 - 400 K/uL   Neutrophils Relative % 85 (*) 43 - 77 %   Neutro Abs 11.1 (*) 1.7 - 7.7 K/uL   Lymphocytes Relative 11 (*) 12 - 46 %   Lymphs Abs 1.4  0.7 - 4.0 K/uL   Monocytes Relative 4  3 - 12 %   Monocytes Absolute 0.5  0.1 - 1.0 K/uL   Eosinophils Relative 0  0 - 5 %   Eosinophils Absolute 0.0  0.0 - 0.7 K/uL   Basophils Relative 0  0 - 1 %   Basophils Absolute 0.0  0.0 - 0.1 K/uL  URINALYSIS, ROUTINE W REFLEX MICROSCOPIC      Result Value Ref Range   Color, Urine YELLOW  YELLOW   APPearance CLEAR  CLEAR   Specific Gravity, Urine 1.013  1.005 - 1.030   pH 7.0  5.0 - 8.0   Glucose, UA NEGATIVE  NEGATIVE mg/dL   Hgb urine dipstick MODERATE (*) NEGATIVE   Bilirubin Urine NEGATIVE  NEGATIVE   Ketones, ur NEGATIVE  NEGATIVE mg/dL   Protein, ur NEGATIVE  NEGATIVE mg/dL   Urobilinogen, UA 0.2  0.0 - 1.0 mg/dL   Nitrite NEGATIVE  NEGATIVE   Leukocytes, UA NEGATIVE  NEGATIVE   URINE MICROSCOPIC-ADD ON      Result Value Ref Range   Squamous Epithelial / LPF RARE  RARE   WBC, UA 0-2  <3 WBC/hpf   RBC / HPF 7-10  <3 RBC/hpf   Bacteria, UA FEW (*) RARE   Urine-Other MUCOUS PRESENT     Results for orders placed during the hospital encounter of 02/16/14  LIPASE, BLOOD      Result Value Ref Range   Lipase 35  11 - 59 U/L  COMPREHENSIVE METABOLIC PANEL      Result Value Ref Range   Sodium 141  137 - 147 mEq/L   Potassium 4.2  3.7 - 5.3 mEq/L   Chloride 102  96 - 112 mEq/L   CO2 24  19 - 32 mEq/L   Glucose, Bld 137 (*) 70 - 99 mg/dL   BUN 13  6 - 23 mg/dL   Creatinine, Ser 7.250.90  0.50 - 1.10 mg/dL   Calcium 9.3  8.4 - 36.610.5 mg/dL   Total Protein 7.7  6.0 - 8.3 g/dL   Albumin 4.4  3.5 - 5.2 g/dL   AST 19  0 - 37 U/L   ALT 15  0 - 35 U/L   Alkaline Phosphatase 75  39 - 117 U/L   Total Bilirubin 0.2 (*) 0.3 - 1.2 mg/dL   GFR calc non Af Amer 66 (*) >90 mL/min   GFR calc Af Amer 76 (*) >90 mL/min  CBC WITH DIFFERENTIAL      Result Value Ref Range   WBC  13.1 (*) 4.0 - 10.5 K/uL   RBC 4.20  3.87 - 5.11 MIL/uL   Hemoglobin 12.7  12.0 - 15.0 g/dL   HCT 16.138.6  09.636.0 - 04.546.0 %   MCV 91.9  78.0 - 100.0 fL   MCH 30.2  26.0 - 34.0 pg   MCHC 32.9  30.0 - 36.0 g/dL   RDW 40.913.6  81.111.5 - 91.415.5 %   Platelets 300  150 - 400 K/uL   Neutrophils Relative % 85 (*) 43 - 77 %   Neutro Abs 11.1 (*) 1.7 - 7.7 K/uL   Lymphocytes Relative 11 (*) 12 - 46 %   Lymphs Abs 1.4  0.7 - 4.0 K/uL   Monocytes Relative 4  3 - 12 %   Monocytes Absolute 0.5  0.1 - 1.0 K/uL   Eosinophils Relative 0  0 - 5 %   Eosinophils Absolute 0.0  0.0 - 0.7 K/uL   Basophils Relative 0  0 - 1 %   Basophils Absolute 0.0  0.0 - 0.1 K/uL  URINALYSIS, ROUTINE W REFLEX MICROSCOPIC      Result Value Ref Range   Color, Urine YELLOW  YELLOW   APPearance CLEAR  CLEAR   Specific Gravity, Urine 1.013  1.005 - 1.030   pH 7.0  5.0 - 8.0   Glucose, UA NEGATIVE  NEGATIVE mg/dL   Hgb urine dipstick MODERATE (*)  NEGATIVE   Bilirubin Urine NEGATIVE  NEGATIVE   Ketones, ur NEGATIVE  NEGATIVE mg/dL   Protein, ur NEGATIVE  NEGATIVE mg/dL   Urobilinogen, UA 0.2  0.0 - 1.0 mg/dL   Nitrite NEGATIVE  NEGATIVE   Leukocytes, UA NEGATIVE  NEGATIVE  URINE MICROSCOPIC-ADD ON      Result Value Ref Range   Squamous Epithelial / LPF RARE  RARE   WBC, UA 0-2  <3 WBC/hpf   RBC / HPF 7-10  <3 RBC/hpf   Bacteria, UA FEW (*) RARE   Urine-Other MUCOUS PRESENT     Ct Abdomen Pelvis W Contrast  02/16/2014   CLINICAL DATA:  Right flank pain, abdominal pain, nausea, vomiting.  EXAM: CT ABDOMEN AND PELVIS WITH CONTRAST  TECHNIQUE: Multidetector CT imaging of the abdomen and pelvis was performed using the standard protocol following bolus administration of intravenous contrast.  CONTRAST:  80mL OMNIPAQUE IOHEXOL 300 MG/ML  SOLN  COMPARISON:  CT ABD/PELVIS W CM dated 01/22/2014  FINDINGS: Lung bases are clear.  No effusions.  Heart is normal size.  There is mild right hydronephrosis and moderate perinephric stranding. 5 mm right UVJ stone is present. Left parapelvic cysts again noted without hydronephrosis. Liver, gallbladder, spleen, pancreas, adrenals unremarkable.  Prior hysterectomy. No adnexal masses. Stomach, large and small bowel grossly unremarkable. No free fluid, free air or adenopathy. Aorta is normal caliber.  No acute or focal bony abnormality.  IMPRESSION: Mild to moderate right hydronephrosis and perinephric stranding. 5 mm right UVJ stone.   Electronically Signed   By: Charlett NoseKevin  Dover M.D.   On: 02/16/2014 12:38   Mm Digital Screening Bilateral  02/04/2014   CLINICAL DATA:  Screening. No family history of breast cancer.  EXAM: DIGITAL SCREENING BILATERAL MAMMOGRAM WITH CAD  COMPARISON:  No comparison studies available.  ACR Breast Density Category c: The breast tissue is heterogeneously dense, which may obscure small masses  FINDINGS: Full field digital screening mammography demonstrates scattered high density round or  punctate calcifications bilaterally with benign features as well as vascular arterial wall calcification. There are no findings  suspicious for malignancy. Images were processed with CAD.  IMPRESSION: No mammographic evidence of malignancy. A result letter of this screening mammogram will be mailed directly to the patient.  RECOMMENDATION: Screening mammogram in one year. (Code:SM-B-01Y)  BI-RADS CATEGORY  2: Benign finding(s).   Electronically Signed   By: Raymondo Band   On: 02/04/2014 13:43      Imaging Review No results found.   EKG Interpretation None      MDM   Final diagnoses:  Ureteral stone    CT consistent with right-sided distal ureteral stone would explain patient's pain. Patient given reassurance will treat with pain medication. Patient given referral to urology. Stone is 5 mm in size is at the UVJ area patient has a good possibility of passing the stone. CT does show moderate right hydronephrosis. Patient improves significantly with treatment provided in the emergency department.    Shelda Jakes, MD 02/26/14 1610  Shelda Jakes, MD 02/26/14 [phone]

## 2014-04-13 ENCOUNTER — Other Ambulatory Visit: Payer: Self-pay | Admitting: Urology

## 2014-04-14 ENCOUNTER — Encounter (HOSPITAL_BASED_OUTPATIENT_CLINIC_OR_DEPARTMENT_OTHER): Payer: Medicare HMO | Admitting: Anesthesiology

## 2014-04-14 ENCOUNTER — Encounter (HOSPITAL_BASED_OUTPATIENT_CLINIC_OR_DEPARTMENT_OTHER)
Admission: RE | Disposition: A | Discharge status: Home / Self Care | Payer: Self-pay | Source: Ambulatory Visit | Attending: Urology

## 2014-04-14 ENCOUNTER — Encounter (HOSPITAL_BASED_OUTPATIENT_CLINIC_OR_DEPARTMENT_OTHER): Payer: Self-pay | Admitting: *Deleted

## 2014-04-14 ENCOUNTER — Ambulatory Visit (HOSPITAL_BASED_OUTPATIENT_CLINIC_OR_DEPARTMENT_OTHER): Payer: Medicare HMO | Admitting: Anesthesiology

## 2014-04-14 ENCOUNTER — Ambulatory Visit (HOSPITAL_BASED_OUTPATIENT_CLINIC_OR_DEPARTMENT_OTHER)
Admission: RE | Admit: 2014-04-14 | Discharge: 2014-04-14 | Disposition: A | Discharge status: Home / Self Care | Payer: Medicare HMO | Source: Ambulatory Visit | Attending: Urology | Admitting: Urology

## 2014-04-14 DIAGNOSIS — I739 Peripheral vascular disease, unspecified: Secondary | ICD-10-CM | POA: Insufficient documentation

## 2014-04-14 DIAGNOSIS — K219 Gastro-esophageal reflux disease without esophagitis: Secondary | ICD-10-CM | POA: Insufficient documentation

## 2014-04-14 DIAGNOSIS — I10 Essential (primary) hypertension: Secondary | ICD-10-CM | POA: Insufficient documentation

## 2014-04-14 DIAGNOSIS — N201 Calculus of ureter: Secondary | ICD-10-CM | POA: Diagnosis present

## 2014-04-14 HISTORY — DX: Other hemorrhoids: K64.8

## 2014-04-14 HISTORY — DX: Gastro-esophageal reflux disease without esophagitis: K21.9

## 2014-04-14 HISTORY — PX: CYSTOSCOPY WITH RETROGRADE PYELOGRAM, URETEROSCOPY AND STENT PLACEMENT: SHX5789

## 2014-04-14 HISTORY — DX: Cerebral aneurysm, nonruptured: I67.1

## 2014-04-14 HISTORY — DX: Calculus of ureter: N20.1

## 2014-04-14 HISTORY — DX: Hyperlipidemia, unspecified: E78.5

## 2014-04-14 LAB — POCT I-STAT 4, (NA,K, GLUC, HGB,HCT)
GLUCOSE: 101 mg/dL — AB (ref 70–99)
HCT: 40 % (ref 36.0–46.0)
Hemoglobin: 13.6 g/dL (ref 12.0–15.0)
POTASSIUM: 4 meq/L (ref 3.7–5.3)
SODIUM: 142 meq/L (ref 137–147)

## 2014-04-14 SURGERY — CYSTOURETEROSCOPY, WITH RETROGRADE PYELOGRAM AND STENT INSERTION
Anesthesia: General | Site: Ureter | Laterality: Right

## 2014-04-14 MED ORDER — CIPROFLOXACIN HCL 500 MG PO TABS: 500.0000 mg | ORAL_TABLET | Freq: Two times a day (BID) | ORAL | Status: DC

## 2014-04-14 MED ORDER — NEOSTIGMINE METHYLSULFATE 10 MG/10ML IV SOLN
INTRAVENOUS | Status: DC | PRN
  Administered 2014-04-14: 3 mg via INTRAVENOUS

## 2014-04-14 MED ORDER — OXYBUTYNIN CHLORIDE 5 MG PO TABS: 5.0000 mg | ORAL_TABLET | Freq: Four times a day (QID) | ORAL | Status: DC | PRN

## 2014-04-14 MED ORDER — PHENAZOPYRIDINE HCL 200 MG PO TABS
200.0000 mg | ORAL_TABLET | Freq: Three times a day (TID) | ORAL | Status: DC | PRN
  Administered 2014-04-14: 200 mg via ORAL
  Filled 2014-04-14: qty 1

## 2014-04-14 MED ORDER — FENTANYL CITRATE 0.05 MG/ML IJ SOLN
INTRAMUSCULAR | Status: AC
  Filled 2014-04-14: qty 4

## 2014-04-14 MED ORDER — IOHEXOL 350 MG/ML SOLN
INTRAVENOUS | Status: DC | PRN
Start: 2014-04-14 — End: 2014-04-14
  Administered 2014-04-14: 15 mL

## 2014-04-14 MED ORDER — OXYCODONE-ACETAMINOPHEN 5-325 MG PO TABS
ORAL_TABLET | ORAL | Status: AC
  Filled 2014-04-14: qty 1

## 2014-04-14 MED ORDER — OXYBUTYNIN CHLORIDE 5 MG PO TABS
ORAL_TABLET | ORAL | Status: AC
  Filled 2014-04-14: qty 1

## 2014-04-14 MED ORDER — FENTANYL CITRATE 0.05 MG/ML IJ SOLN
INTRAMUSCULAR | Status: DC | PRN
Start: 2014-04-14 — End: 2014-04-14
  Administered 2014-04-14: 50 ug via INTRAVENOUS

## 2014-04-14 MED ORDER — FENTANYL CITRATE 0.05 MG/ML IJ SOLN: INTRAMUSCULAR | Status: DC | PRN

## 2014-04-14 MED ORDER — PHENAZOPYRIDINE HCL 200 MG PO TABS: 200.0000 mg | ORAL_TABLET | Freq: Three times a day (TID) | ORAL | Status: DC | PRN

## 2014-04-14 MED ORDER — PHENAZOPYRIDINE HCL 100 MG PO TABS
ORAL_TABLET | ORAL | Status: AC
  Filled 2014-04-14: qty 1

## 2014-04-14 MED ORDER — LIDOCAINE HCL (CARDIAC) 20 MG/ML IV SOLN
INTRAVENOUS | Status: DC | PRN
  Administered 2014-04-14: 80 mg via INTRAVENOUS

## 2014-04-14 MED ORDER — ROCURONIUM BROMIDE 100 MG/10ML IV SOLN
INTRAVENOUS | Status: DC | PRN
  Administered 2014-04-14: 25 mg via INTRAVENOUS

## 2014-04-14 MED ORDER — LACTATED RINGERS IV SOLN
INTRAVENOUS | Status: DC
  Filled 2014-04-14: qty 1000

## 2014-04-14 MED ORDER — KETOROLAC TROMETHAMINE 30 MG/ML IJ SOLN
INTRAMUSCULAR | Status: DC | PRN
Start: 2014-04-14 — End: 2014-04-14
  Administered 2014-04-14: 30 mg via INTRAVENOUS

## 2014-04-14 MED ORDER — TAMSULOSIN HCL 0.4 MG PO CAPS: 0.4000 mg | ORAL_CAPSULE | Freq: Every day | ORAL | Status: DC

## 2014-04-14 MED ORDER — OXYCODONE-ACETAMINOPHEN 5-325 MG PO TABS
1.0000 | ORAL_TABLET | Freq: Four times a day (QID) | ORAL | Status: DC | PRN
  Administered 2014-04-14: 1 via ORAL
  Filled 2014-04-14: qty 2

## 2014-04-14 MED ORDER — GLYCOPYRROLATE 0.2 MG/ML IJ SOLN
INTRAMUSCULAR | Status: DC | PRN
  Administered 2014-04-14: 0.4 mg via INTRAVENOUS

## 2014-04-14 MED ORDER — ACETAMINOPHEN 10 MG/ML IV SOLN
INTRAVENOUS | Status: DC | PRN
  Administered 2014-04-14: 1000 mg via INTRAVENOUS

## 2014-04-14 MED ORDER — CEFAZOLIN SODIUM-DEXTROSE 2-3 GM-% IV SOLR
2.0000 g | INTRAVENOUS | Status: AC
  Administered 2014-04-14: 2 g via INTRAVENOUS
  Filled 2014-04-14: qty 50

## 2014-04-14 MED ORDER — SODIUM CHLORIDE 0.9 % IR SOLN
Status: DC | PRN
  Administered 2014-04-14: 6000 mL

## 2014-04-14 MED ORDER — BELLADONNA ALKALOIDS-OPIUM 16.2-60 MG RE SUPP
RECTAL | Status: AC
  Filled 2014-04-14: qty 1

## 2014-04-14 MED ORDER — MIDAZOLAM HCL 5 MG/5ML IJ SOLN
INTRAMUSCULAR | Status: DC | PRN
  Administered 2014-04-14: 2 mg via INTRAVENOUS

## 2014-04-14 MED ORDER — ONDANSETRON HCL 4 MG/2ML IJ SOLN
INTRAMUSCULAR | Status: DC | PRN
  Administered 2014-04-14: 4 mg via INTRAVENOUS

## 2014-04-14 MED ORDER — MIDAZOLAM HCL 2 MG/2ML IJ SOLN
INTRAMUSCULAR | Status: AC
  Filled 2014-04-14: qty 2

## 2014-04-14 MED ORDER — PROPOFOL 10 MG/ML IV BOLUS
INTRAVENOUS | Status: DC | PRN
  Administered 2014-04-14: 150 mg via INTRAVENOUS

## 2014-04-14 MED ORDER — SENNOSIDES-DOCUSATE SODIUM 8.6-50 MG PO TABS: 1.0000 | ORAL_TABLET | Freq: Two times a day (BID) | ORAL | Status: DC

## 2014-04-14 MED ORDER — FENTANYL CITRATE 0.05 MG/ML IJ SOLN
25.0000 ug | INTRAMUSCULAR | Status: DC | PRN
  Filled 2014-04-14: qty 1

## 2014-04-14 MED ORDER — LACTATED RINGERS IV SOLN
INTRAVENOUS | Status: DC
  Administered 2014-04-14 (×2): via INTRAVENOUS
  Filled 2014-04-14: qty 1000

## 2014-04-14 MED ORDER — OXYBUTYNIN CHLORIDE 5 MG PO TABS
5.0000 mg | ORAL_TABLET | Freq: Three times a day (TID) | ORAL | Status: DC
  Administered 2014-04-14: 5 mg via ORAL
  Filled 2014-04-14: qty 1

## 2014-04-14 MED ORDER — DEXAMETHASONE SODIUM PHOSPHATE 10 MG/ML IJ SOLN
INTRAMUSCULAR | Status: DC | PRN
  Administered 2014-04-14: 10 mg via INTRAVENOUS

## 2014-04-14 MED ORDER — PROPOFOL INFUSION 10 MG/ML OPTIME: INTRAVENOUS | Status: DC | PRN

## 2014-04-14 SURGICAL SUPPLY — 38 items
BAG DRAIN URO-CYSTO SKYTR STRL (DRAIN) ×4 IMPLANT
BASKET LASER NITINOL 1.9FR (BASKET) IMPLANT
BASKET STNLS GEMINI 4WIRE 3FR (BASKET) IMPLANT
BASKET ZERO TIP NITINOL 2.4FR (BASKET) IMPLANT
CANISTER SUCT LVC 12 LTR MEDI- (MISCELLANEOUS) ×4 IMPLANT
CATH CLEAR GEL 3F BACKSTOP (CATHETERS) IMPLANT
CATH INTERMIT  6FR 70CM (CATHETERS) IMPLANT
CATH URET 5FR 28IN CONE TIP (BALLOONS)
CATH URET 5FR 28IN OPEN ENDED (CATHETERS) ×4 IMPLANT
CATH URET 5FR 70CM CONE TIP (BALLOONS) IMPLANT
CATH URET DUAL LUMEN 6-10FR 50 (CATHETERS) ×4 IMPLANT
CLOTH BEACON ORANGE TIMEOUT ST (SAFETY) ×4 IMPLANT
DRAPE CAMERA CLOSED 9X96 (DRAPES) ×4 IMPLANT
ELECT REM PT RETURN 9FT ADLT (ELECTROSURGICAL)
ELECTRODE REM PT RTRN 9FT ADLT (ELECTROSURGICAL) IMPLANT
FIBER LASER FLEXIVA 200 (UROLOGICAL SUPPLIES) IMPLANT
FIBER LASER FLEXIVA 365 (UROLOGICAL SUPPLIES) IMPLANT
GLOVE BIO SURGEON STRL SZ7 (GLOVE) ×4 IMPLANT
GLOVE ECLIPSE 7.0 STRL STRAW (GLOVE) ×4 IMPLANT
GLOVE INDICATOR 7.5 STRL GRN (GLOVE) ×4 IMPLANT
GOWN PREVENTION PLUS LG XLONG (DISPOSABLE) ×4 IMPLANT
GOWN STRL REUS W/ TWL XL LVL3 (GOWN DISPOSABLE) ×4 IMPLANT
GOWN STRL REUS W/TWL XL LVL3 (GOWN DISPOSABLE) ×4
GUIDEWIRE 0.038 PTFE COATED (WIRE) IMPLANT
GUIDEWIRE ANG ZIPWIRE 038X150 (WIRE) IMPLANT
GUIDEWIRE STR DUAL SENSOR (WIRE) ×8 IMPLANT
IV NS IRRIG 3000ML ARTHROMATIC (IV SOLUTION) ×4 IMPLANT
KIT BALLIN UROMAX 15FX10 (LABEL) IMPLANT
KIT BALLN UROMAX 15FX4 (MISCELLANEOUS) IMPLANT
KIT BALLN UROMAX 26 75X4 (MISCELLANEOUS)
PACK CYSTOSCOPY (CUSTOM PROCEDURE TRAY) ×4 IMPLANT
SET HIGH PRES BAL DIL (LABEL)
SHEATH ACCESS URETERAL 38CM (SHEATH) IMPLANT
SHEATH ACCESS URETERAL 54CM (SHEATH) IMPLANT
SHEATH URET ACCESS 12FR/35CM (UROLOGICAL SUPPLIES) IMPLANT
SHEATH URET ACCESS 12FR/55CM (UROLOGICAL SUPPLIES) IMPLANT
STENT POLARIS 5FRX22 (STENTS) ×4 IMPLANT
SYRINGE IRR TOOMEY STRL 70CC (SYRINGE) IMPLANT

## 2014-04-14 NOTE — Anesthesia Preprocedure Evaluation (Addendum)
Anesthesia Evaluation  Patient identified by MRN, date of birth, ID band Patient awake    Reviewed: Allergy & Precautions, H&P , NPO status , Patient's Chart, lab work & pertinent test results  Airway Mallampati: II TM Distance: >3 FB Neck ROM: full    Dental no notable dental hx. (+) Upper Dentures, Dental Advisory Given, Teeth Intact   Pulmonary neg pulmonary ROS,  breath sounds clear to auscultation  Pulmonary exam normal       Cardiovascular Exercise Tolerance: Good hypertension, Pt. on home beta blockers and Pt. on medications + Peripheral Vascular Disease Rhythm:regular Rate:Normal     Neuro/Psych Aneurysm opthalmic artery negative psych ROS   GI/Hepatic negative GI ROS, Neg liver ROS, GERD-  Medicated and Controlled,  Endo/Other  negative endocrine ROS  Renal/GU negative Renal ROS  negative genitourinary   Musculoskeletal   Abdominal   Peds  Hematology negative hematology ROS (+)   Anesthesia Other Findings   Reproductive/Obstetrics negative OB ROS                          Anesthesia Physical Anesthesia Plan  ASA: III  Anesthesia Plan: General   Post-op Pain Management:    Induction: Intravenous  Airway Management Planned: LMA  Additional Equipment:   Intra-op Plan:   Post-operative Plan:   Informed Consent: I have reviewed the patients History and Physical, chart, labs and discussed the procedure including the risks, benefits and alternatives for the proposed anesthesia with the patient or authorized representative who has indicated his/her understanding and acceptance.   Dental Advisory Given  Plan Discussed with: CRNA and Surgeon  Anesthesia Plan Comments:         Anesthesia Quick Evaluation

## 2014-04-14 NOTE — Anesthesia Procedure Notes (Signed)
Procedure Name: Intubation Date/Time: 04/14/2014 1:37 PM Performed by: Tyrone Nine Pre-anesthesia Checklist: Patient identified, Timeout performed, Emergency Drugs available, Suction available and Patient being monitored Patient Re-evaluated:Patient Re-evaluated prior to inductionOxygen Delivery Method: Circle system utilized Preoxygenation: Pre-oxygenation with 100% oxygen Intubation Type: IV induction Ventilation: Mask ventilation without difficulty Laryngoscope Size: Mac and 3 Grade View: Grade I Tube type: Oral Tube size: 7.0 mm Number of attempts: 1 Airway Equipment and Method: Stylet and Bite block Placement Confirmation: ETT inserted through vocal cords under direct vision,  breath sounds checked- equal and bilateral and positive ETCO2 Secured at: 20 cm Tube secured with: Tape Dental Injury: Teeth and Oropharynx as per pre-operative assessment

## 2014-04-14 NOTE — Discharge Instructions (Signed)
DISCHARGE INSTRUCTIONS FOR KIDNEY STONES OR URETERAL STENT  MEDICATIONS:   1. Resume all your other meds from home.  ACTIVITY 1. No strenuous activity x 1week 2. No driving while on narcotic pain medications 3. Drink plenty of water 4. Continue to walk at home - you can still get blood clots when you are at home, so keep active, but don't over do it. 5. May return to work in 3 days.  BATHING 1. You can shower. 2. If you have a string coming from your urethra:  The stent string is attached to your ureteral stent.  Do not pull on this.  If the stent gets pulled our partially before it is time to remove it, go ahead and remove the entire stent.  Call if you develop significant pain that lasts more than an hour.    SIGNS/SYMPTOMS TO CALL: 1. Please call us if you have a fever greater than 101.5, uncontrolled  nausea/vomiting, uncontrolled pain, dizziness, unable to urinate, chest pain, shortness of breath, leg swelling, leg pain, redness around wound, drainage from wound, or any other concerns or questions.  You can reach [place] at [phone].  FOLLOW-UP If you have a string attached to your stent, you may remove it on Monday, 04/19/14.  To do this, pull the string until the stent is completely removed.  You may feel an odd sensation in your back.CYSTOSCOPY HOME CARE INSTRUCTIONS  Activity: Rest for the remainder of the day.  Do not drive or operate equipment today.  You may resume normal activities in one to two days as instructed by your physician.   Meals: Drink plenty of liquids and eat light foods such as gelatin or soup this evening.  You may return to a normal meal plan tomorrow.  Return to Work: You may return to work in one to two days or as instructed by your physician.  Special Instructions / Symptoms: Call your physician if any of these symptoms occur:   -persistent or heavy bleeding  -bleeding which continues after first few urination  -large blood clots that are  difficult to pass  -urine stream diminishes or stops completely  -fever equal to or higher than 101 degrees Farenheit.  -cloudy urine with a strong, foul odor  -severe pain  Females should always wipe from front to back after elimination.  You may feel some burning pain when you urinate.  This should disappear with time.  Applying moist heat to the lower abdomen or a hot tub bath may help relieve the pain. \  Follow-Up / Date of Return Visit to Your Physician:  *** Call for an appointment to arrange follow-up.  Patient Signature:  ________________________________________________________  Nurse's Signature:  ________________________________________________________  Post Anesthesia Home Care Instructions  Activity: Get plenty of rest for the remainder of the day. A responsible adult should stay with you for 24 hours following the procedure.  For the next 24 hours, DO NOT: -Drive a car -Advertising copywriter -Drink alcoholic beverages -Take any medication unless instructed by your physician -Make any legal decisions or sign important papers.  Meals: Start with liquid foods such as gelatin or soup. Progress to regular foods as tolerated. Avoid greasy, spicy, heavy foods. If nausea and/or vomiting occur, drink only clear liquids until the nausea and/or vomiting subsides. Call your physician if vomiting continues.  Special Instructions/Symptoms: Your throat may feel dry or sore from the anesthesia or the breathing tube placed in your throat during surgery. If this causes discomfort, gargle with warm salt  discomfort should disappear within 24 hours. ° °

## 2014-04-14 NOTE — Progress Notes (Signed)
Pt daughter called asking about bp meds to take today.  Pt can take metoprolol, norvasc, and prilosec today w/ sips of water and if needed take oxycodone.

## 2014-04-14 NOTE — Anesthesia Postprocedure Evaluation (Signed)
  Anesthesia Post-op Note  Patient: Natalie Willis  Procedure(s) Performed: Procedure(s) (LRB): CYSTOSCOPY WITH RETROGRADE PYELOGRAM, URETEROSCOPY AND STENT PLACEMENT (Right)  Patient Location: PACU  Anesthesia Type: General  Level of Consciousness: awake and alert   Airway and Oxygen Therapy: Patient Spontanous Breathing  Post-op Pain: mild  Post-op Assessment: Post-op Vital signs reviewed, Patient's Cardiovascular Status Stable, Respiratory Function Stable, Patent Airway and No signs of Nausea or vomiting  Last Vitals:  Filed Vitals:   04/14/14 1430  BP: 119/63  Pulse: 93  Temp:   Resp: 19    Post-op Vital Signs: stable   Complications: No apparent anesthesia complications

## 2014-04-14 NOTE — Transfer of Care (Signed)
Immediate Anesthesia Transfer of Care Note  Patient: Natalie Willis  Procedure(s) Performed: Procedure(s): CYSTOSCOPY WITH RETROGRADE PYELOGRAM, URETEROSCOPY AND STENT PLACEMENT (Right)  Patient Location: PACU  Anesthesia Type:General  Level of Consciousness: awake, sedated and patient cooperative  Airway & Oxygen Therapy: Patient Spontanous Breathing and Patient connected to nasal cannula oxygen  Post-op Assessment: Report given to PACU RN and Post -op Vital signs reviewed and stable  Post vital signs: Reviewed and stable  Complications: No apparent anesthesia complications

## 2014-04-14 NOTE — Op Note (Signed)
Urology Operative Report  Date of Procedure: 04/14/14  Surgeon: Natalia Leatherwood, MD Assistant:  None  Preoperative Diagnosis: Right ureter stone. Postoperative Diagnosis:  Same  Procedure(s): Right ureteroscopy. Right retrograde pyelogram with interpretation. Right ureter stent placement (5x22 polaris with tether). Cystoscopy.  Estimated blood loss: None  Specimen: None  Drains: Non  Complications: None  Findings: Negative filling defects or hydronephrosis on right retrograde pyelogram. Negative stones in the proximal, mid and distal ureter.  History of present illness: 68 year old female with a history of chronic abdominal pain presented to my clinic with history of right flank pain. She was concerned that she had a stone in her right distal ureter seen on CT scan in April 2015. She did not pass the stone continued to have pain. We discussed management options and she elected to proceed with ureteroscopy for stone removal. She presents today for that procedure.   Procedure in detail: After informed consent was obtained, the patient was taken to the operating room. They were placed in the supine position. SCDs were turned on and in place. IV antibiotics were infused, and general anesthesia was induced. A timeout was performed in which the correct patient, surgical site, and procedure were identified and agreed upon by the team.  The patient was placed in a dorsolithotomy position, making sure to pad all pertinent neurovascular pressure points. A belladonna and opium suppository was placed into the rectum. The genitals were prepped and draped in the usual sterile fashion.  Rigid cystoscope was advanced through the urethra and into the bladder. The bladder was drained and then fully distended and evaluated in a systematic fashion visualized entire surface of the bladder. This was negative for tumors or erythema. Both ureter orifices were seen to be effluxing clear urine.  I  obtained a right retrograde pyelogram by cannulating the right ureter orifice with a 5 French ureter catheter and injecting 6 cc of Omnipaque. This was negative for filling defects, strictures, or hydronephrosis. This side drained well.  I removed the 5 French catheter and placed a sensor wire into the right ureter and up into the right renal pelvis on fluoroscopy. This was secured as a safety wire.  I then placed the flexible digital semirigid ureteroscope to the urethra after the patient had been paralyzed. His is navigated through the bladder and into the distal right ureter. I encountered some tortuosity of the distal ureter and therefore removed the semirigid ureteroscope.  I calibrated to ureter with a dual-lumen ureter access sheath and placing this over the safety wire under fluoroscopy. This was placed with ease into the proximal ureter. This was removed and the safety wire was secured against the drape.  Then advanced a semirigid ureteroscope through the urethra, into the bladder, and up the ureter with ease until I reached the proximal ureter. There was no stone noted. I withdrew the ureter scope and visualized the entire ureter. There was no injury to the mucosa noted.  I elected to leave a right ureter stent to ensure maximum drainage of this side given her history of chronic pain.  I loaded the safety wire through the cystoscope and placed a 5 x 22 Polaris stent with tether over the wire through the scope under fluoroscopy. This was placed with ease. It was deployed with a good curl in the right renal pelvis and the loops within the bladder. The bladder was drained and the cystoscope was removed. I placed 10 cc of lidocaine jelly into the urethra.  The string was  secured to the patient's mons pubis.  This completed the procedure, she's placed back in a supine position, anesthesia was reversed, and she was taken to the PACU in a stable condition.  All counts were correct at the end of  the case.  She'll be instructed to remove the stent on Monday, 04/19/14. She'll start a 3 day course of ciprofloxacin one day before stent removal.

## 2014-04-14 NOTE — H&P (Signed)
Urology History and Physical Exam  CC: Right ureter stone.  HPI:  68 year old female presents today for a ureter stone. Here niece is with her today. This is located in the right distal ureter.  This has been associated with severe right flank pain.  This was first discovered on CT scan in April 2015.  It is 5 mm in size.  There are no other stones in her urinary system.  We have discussed management options along with risks,  benefits, alternatives, and likelihood of achieving goals.  She presents today for cystoscopy, right ureteroscopy, laser lithotripsy, right retrograde pyelogram, and possible right ureter stent placement.  She understands that she may have a large amount of swelling given the fact that her stone has been present for such a long period of time and that she may have to have a stent placement with staged ureteroscopy.  UA 04/13/14 was negative for signs of infection.  PMH: Past Medical History  Diagnosis Date  . Hypertension   . Cerebral aneurysm   . Hematuria     PSH: Past Surgical History  Procedure Laterality Date  . Appendectomy    . Abdominal hysterectomy      Allergies: Allergies  Allergen Reactions  . Dilaudid [Hydromorphone Hcl] Nausea And Vomiting    Not an allergy, but pt appears sensitive to IV narcotics.     Medications: No prescriptions prior to admission     Social History: History   Social History  . Marital Status: Legally Separated    Spouse Name: N/A    Number of Children: N/A  . Years of Education: N/A   Occupational History  . Not on file.   Social History Main Topics  . Smoking status: Never Smoker   . Smokeless tobacco: Not on file  . Alcohol Use: No  . Drug Use: No  . Sexual Activity: Not on file   Other Topics Concern  . Not on file   Social History Narrative  . No narrative on file    Family History: No family history on file.  Review of Systems: Positive: Nausea, flank pain. Negative: Fever, SOB, or chest  pain.  A further 10 point review of systems was negative except what is listed in the HPI.  Physical Exam: Filed Vitals:   04/14/14 1054  BP: 153/74  Pulse: 78  Temp: 97.5 F (36.4 C)  Resp: 16    General: No acute distress.  Awake. Head:  Normocephalic.  Atraumatic. ENT:  EOMI.  Mucous membranes moist Neck:  Supple.  No lymphadenopathy. CV:  S1 present. S2 present. Regular rate. Pulmonary: Equal effort bilaterally.  Clear to auscultation bilaterally. Abdomen: Soft.  Non- tender to palpation. Skin:  Normal turgor.  No visible rash. Extremity: No gross deformity of bilateral upper extremities.  No gross deformity of    bilateral lower extremities. Neurologic: Alert. Appropriate mood.   Studies:  No results found for this basename: HGB, WBC, PLT,  in the last 72 hours  No results found for this basename: NA, K, CL, CO2, BUN, CREATININE, CALCIUM, MAGNESIUM, GFRNONAA, GFRAA,  in the last 72 hours   No results found for this basename: PT, INR, APTT,  in the last 72 hours   No components found with this basename: ABG,     Assessment:  Right ureter stone.  Plan: To OR  for cystoscopy, right ureteroscopy, laser lithotripsy, right retrograde pyelogram, and possible right ureter stent placement.

## 2014-04-15 ENCOUNTER — Encounter (HOSPITAL_BASED_OUTPATIENT_CLINIC_OR_DEPARTMENT_OTHER): Payer: Self-pay | Admitting: Urology

## 2014-04-19 ENCOUNTER — Emergency Department (HOSPITAL_COMMUNITY)
Admission: EM | Admit: 2014-04-19 | Discharge: 2014-04-20 | Disposition: A | Discharge status: Home / Self Care | Payer: Medicare HMO | Attending: Emergency Medicine | Admitting: Emergency Medicine

## 2014-04-19 ENCOUNTER — Encounter (HOSPITAL_COMMUNITY): Payer: Self-pay | Admitting: Emergency Medicine

## 2014-04-19 DIAGNOSIS — Z90710 Acquired absence of both cervix and uterus: Secondary | ICD-10-CM | POA: Diagnosis not present

## 2014-04-19 DIAGNOSIS — Z9089 Acquired absence of other organs: Secondary | ICD-10-CM | POA: Insufficient documentation

## 2014-04-19 DIAGNOSIS — IMO0002 Reserved for concepts with insufficient information to code with codable children: Secondary | ICD-10-CM | POA: Diagnosis not present

## 2014-04-19 DIAGNOSIS — Z9889 Other specified postprocedural states: Secondary | ICD-10-CM | POA: Diagnosis not present

## 2014-04-19 DIAGNOSIS — Z791 Long term (current) use of non-steroidal anti-inflammatories (NSAID): Secondary | ICD-10-CM | POA: Insufficient documentation

## 2014-04-19 DIAGNOSIS — E785 Hyperlipidemia, unspecified: Secondary | ICD-10-CM | POA: Diagnosis not present

## 2014-04-19 DIAGNOSIS — I10 Essential (primary) hypertension: Secondary | ICD-10-CM | POA: Diagnosis not present

## 2014-04-19 DIAGNOSIS — N23 Unspecified renal colic: Secondary | ICD-10-CM | POA: Insufficient documentation

## 2014-04-19 DIAGNOSIS — K649 Unspecified hemorrhoids: Secondary | ICD-10-CM | POA: Diagnosis not present

## 2014-04-19 DIAGNOSIS — R112 Nausea with vomiting, unspecified: Secondary | ICD-10-CM | POA: Diagnosis not present

## 2014-04-19 DIAGNOSIS — Z792 Long term (current) use of antibiotics: Secondary | ICD-10-CM | POA: Diagnosis not present

## 2014-04-19 DIAGNOSIS — Z79899 Other long term (current) drug therapy: Secondary | ICD-10-CM | POA: Insufficient documentation

## 2014-04-19 DIAGNOSIS — K219 Gastro-esophageal reflux disease without esophagitis: Secondary | ICD-10-CM | POA: Insufficient documentation

## 2014-04-19 DIAGNOSIS — K625 Hemorrhage of anus and rectum: Secondary | ICD-10-CM | POA: Diagnosis present

## 2014-04-19 LAB — URINALYSIS, ROUTINE W REFLEX MICROSCOPIC
BILIRUBIN URINE: NEGATIVE
Glucose, UA: NEGATIVE mg/dL
Ketones, ur: NEGATIVE mg/dL
Leukocytes, UA: NEGATIVE
NITRITE: POSITIVE — AB
Protein, ur: >300 mg/dL — AB
Specific Gravity, Urine: 1.014 (ref 1.005–1.030)
UROBILINOGEN UA: 0.2 mg/dL (ref 0.0–1.0)
pH: 6 (ref 5.0–8.0)

## 2014-04-19 LAB — I-STAT CHEM 8, ED
BUN: 14 mg/dL (ref 6–23)
CALCIUM ION: 1.24 mmol/L (ref 1.13–1.30)
Chloride: 104 mEq/L (ref 96–112)
Creatinine, Ser: 1.2 mg/dL — ABNORMAL HIGH (ref 0.50–1.10)
GLUCOSE: 148 mg/dL — AB (ref 70–99)
HCT: 38 % (ref 36.0–46.0)
HEMOGLOBIN: 12.9 g/dL (ref 12.0–15.0)
POTASSIUM: 3.8 meq/L (ref 3.7–5.3)
Sodium: 143 mEq/L (ref 137–147)
TCO2: 23 mmol/L (ref 0–100)

## 2014-04-19 LAB — CBC
HCT: 36.5 % (ref 36.0–46.0)
HEMOGLOBIN: 11.7 g/dL — AB (ref 12.0–15.0)
MCH: 29.6 pg (ref 26.0–34.0)
MCHC: 32.1 g/dL (ref 30.0–36.0)
MCV: 92.4 fL (ref 78.0–100.0)
Platelets: 323 10*3/uL (ref 150–400)
RBC: 3.95 MIL/uL (ref 3.87–5.11)
RDW: 13.1 % (ref 11.5–15.5)
WBC: 10.2 10*3/uL (ref 4.0–10.5)

## 2014-04-19 LAB — URINE MICROSCOPIC-ADD ON

## 2014-04-19 LAB — POC OCCULT BLOOD, ED: Fecal Occult Bld: NEGATIVE

## 2014-04-19 MED ORDER — SODIUM CHLORIDE 0.9 % IV BOLUS (SEPSIS)
1000.0000 mL | Freq: Once | INTRAVENOUS | Status: AC
  Administered 2014-04-19: 1000 mL via INTRAVENOUS

## 2014-04-19 MED ORDER — ONDANSETRON HCL 4 MG/2ML IJ SOLN
4.0000 mg | Freq: Once | INTRAMUSCULAR | Status: AC
  Administered 2014-04-19: 4 mg via INTRAVENOUS
  Filled 2014-04-19: qty 2

## 2014-04-19 MED ORDER — MORPHINE SULFATE 4 MG/ML IJ SOLN
6.0000 mg | Freq: Once | INTRAMUSCULAR | Status: AC
  Administered 2014-04-19: 4 mg via INTRAVENOUS
  Filled 2014-04-19: qty 2

## 2014-04-19 NOTE — ED Notes (Signed)
Pt nauseated 

## 2014-04-19 NOTE — Discharge Instructions (Signed)
Clculos renales (Kidney Stones) Keep your scheduled with Dr. Margarita GrizzleWoodruff in 2 days. You can take her Percocet and nausea medicine as directed. Return if pain or nausea and are not under control. Your blood sugar is mildly elevated today at 148. Contact your primary care Doctor this week. You may be borderline diabetic Los clculos renales (urolitiasis) son masas slidas que se forman en el interior de los riones. El dolor intenso es causado por el movimiento de la piedra a travs del rin, urter, vejiga y Advertising account executiveuretra (tracto urinario). Cuando la piedra se mueve, el urter hace un espasmo alrededor de la misma. El clculo generalmente se elimina con el pis (la [place]).  CUIDADOS EN EL HOGAR  Debe ingerir gran cantidad de lquido para mantener la orina de tono claro o color amarillo plido. Esto ayudar a eliminar la piedra.  Cuele la orina con el colador que le han provisto. Noorine de otra forma que no sea a travs del Engineer, manufacturing systemscolador, ni siquiera una vez. Si elimina la piedra, se retendr en el colador. Puede ser tan pequea como un grano de sal. Llvela a su visita con el mdico. Esto ayudar a que el mdico le indique qu puede hacer para tratar de prevenir la ocurrencia de nuevos clculos renales.  Slo tome los medicamentos que le haya indicado su mdico.  Concurra a las consultas de control con el mdico, segn las indicaciones.  Hgase las radiografas segn las indicaciones de su mdico. SOLICITE AYUDA SI: Siente un dolor que empeora an tomando analgsicos. SOLICITE AYUDA DE INMEDIATO SI:   El dolor no mejora con los medicamentos recetados.  Siente escalofros o fiebre.  El dolor aumenta y Insurance underwriterempeora en las siguientes 18 horas.  Siente un nuevo dolor en el vientre (abdominal).  Sufre mareos o se desmaya.  Nota que no puede orinar. ASEGRESE DE QUE:   Comprende estas instrucciones.  Controlar su afeccin.  Recibir ayuda de inmediato si no mejora o si empeora. Document Released:  01/18/2009 Document Revised: 06/24/2013 [facility] Patient Information 2014 [place], [place].

## 2014-04-19 NOTE — ED Notes (Signed)
MD Jacubowitz at bedside.  

## 2014-04-19 NOTE — ED Notes (Signed)
Urine sample was obtained when the Pt went to the bathroom, it is in a specimen cup on the sink in her room.

## 2014-04-19 NOTE — ED Notes (Signed)
Pt arrived to the ED with a complaint of hematuria and rectal bleeding.  Pt states that she had a urethral stent placed in on Wednesday.  Pt took out the stent today as instructed.  Pt has been experiencing hematuria since the procedure but it has increased since pulling out the stent.  As well the patient has had rectal bleed with clots today.  Pt is  Experiencing severs lower abdominal pain nad N/V

## 2014-04-19 NOTE — ED Provider Notes (Signed)
CSN: [id number]     Arrival date & time 04/19/14  2121 History   First MD Initiated Contact with Patient 04/19/14 2216     Chief Complaint  Patient presents with  . Rectal Bleeding  . Hematuria   Patient speaks no [place] .History is obtained from patient's daughter acting as interpreter. A medical interpreter was offered to the patient which he declines  (Consider location/radiation/quality/duration/timing/severity/associated sxs/prior Treatment) HPI Complains of low bowel pain radiating to right flank onset 4:30 PM today after removing her ureteral stent intact. She's had hematuria since the event and also bleeding from her hemorrhoids. Other associated symptoms include vomiting twice she treated herself with Percocet, without relief. No fever. No other associated symptoms. Past Medical History  Diagnosis Date  . Hypertension   . Hematuria   . Right ureteral stone   . 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 Internal hemorrhoid   . Hyperlipidemia   . GERD (gastroesophageal reflux disease)    Past Surgical History  Procedure Laterality Date  . Appendectomy    . Abdominal hysterectomy    . Colonoscopy  02-15-2012  . Cardiac catheterization  10/ 2009   DR  [facility]    NORMAL CORONARIES AND  LVSF  . Transthoracic echocardiogram  10-19-2008    NORMAL STUDY/  EF 55%  . Cystoscopy with retrograde pyelogram, ureteroscopy and stent placement Right 04/14/2014    Procedure: CYSTOSCOPY WITH RETROGRADE PYELOGRAM, URETEROSCOPY AND STENT PLACEMENT;  Surgeon: Magdalene Molly, MD;  Location: [facility];  Service: Urology;  Laterality: Right;   History reviewed. No pertinent family history. History  Substance Use Topics  . Smoking status: Never Smoker   . Smokeless tobacco: Not on file  . Alcohol Use: No   OB History   Grav Para Term Preterm Abortions TAB SAB Ect Mult Living                 Review of Systems   Gastrointestinal: Positive for nausea, vomiting, abdominal pain and blood in stool.  Genitourinary: Positive for hematuria and flank pain.  All other systems reviewed and are negative.     Allergies  Dilaudid  Home Medications   Prior to Admission medications   Medication Sig Start Date End Date Taking? Authorizing Provider  albuterol (PROVENTIL HFA;VENTOLIN HFA) 108 (90 BASE) MCG/ACT inhaler Inhale 2 puffs into the lungs every 6 (six) hours as needed for wheezing.   Yes Historical Provider, MD  amLODipine (NORVASC) 5 MG tablet Take 5 mg by mouth daily.   Yes Historical Provider, MD  ciprofloxacin (CIPRO) 500 MG tablet Take 1 tablet (500 mg total) by mouth 2 (two) times daily. Begin on Sunday, 04/18/14. 04/14/14  Yes Magdalene Molly, MD  hydrocortisone (PROCTOZONE-HC) 2.5 % rectal cream Place 1 application rectally 2 (two) times daily. 02/09/14  Yes Romie Levee, MD  ibuprofen (ADVIL,MOTRIN) 800 MG tablet Take 1 tablet (800 mg total) by mouth 3 (three) times daily. 01/23/14  Yes Junius Argyle, MD  lisinopril (PRINIVIL,ZESTRIL) 40 MG tablet Take 40 mg by mouth daily.   Yes Historical Provider, MD  metoprolol tartrate (LOPRESSOR) 25 MG tablet Take 12.5 mg by mouth daily.    Yes Historical Provider, MD  Multiple Vitamin (MULTIVITAMIN WITH MINERALS) TABS tablet Take 1 tablet by mouth daily.   Yes Historical Provider, MD  omeprazole (PRILOSEC) 20 MG capsule Take 20 mg by  mouth daily.   Yes Historical Provider, MD  oxybutynin (DITROPAN) 5 MG tablet Take 1 tablet (5 mg total) by mouth every 6 (six) hours as needed for bladder spasms. 04/14/14  Yes Magdalene Mollyaniel Y Woodruff, MD  oxyCODONE-acetaminophen (PERCOCET/ROXICET) 5-325 MG per tablet Take 1-2 tablets by mouth every 6 (six) hours as needed for severe pain. 02/16/14  Yes Vanetta MuldersScott Zackowski, MD  phenazopyridine (PYRIDIUM) 200 MG tablet Take 1 tablet (200 mg total) by mouth 3 (three) times daily as needed for pain. 04/14/14  Yes Magdalene Mollyaniel Y Woodruff, MD   promethazine (PHENERGAN) 25 MG tablet Take 1 tablet (25 mg total) by mouth every 6 (six) hours as needed for nausea or vomiting. 02/16/14  Yes Vanetta MuldersScott Zackowski, MD  senna-docusate (SENOKOT S) 8.6-50 MG per tablet Take 1 tablet by mouth 2 (two) times daily. 04/14/14  Yes Magdalene Mollyaniel Y Woodruff, MD  simvastatin (ZOCOR) 20 MG tablet Take 20 mg by mouth every evening.   Yes Historical Provider, MD  tamsulosin (FLOMAX) 0.4 MG CAPS capsule Take 1 capsule (0.4 mg total) by mouth at bedtime. 04/14/14  Yes Magdalene Mollyaniel Y Woodruff, MD  zolpidem (AMBIEN) 5 MG tablet Take 2.5-5 mg by mouth at bedtime as needed for sleep. Take 2.5 mg by mouth at bedtime as needed for sleep, may take an additional 2.5 mg if necessary   Yes Historical Provider, MD   BP 151/131  Pulse 104  Temp(Src) 98.1 F (36.7 C) (Oral)  Resp 16  SpO2 95% Physical Exam  Nursing note and vitals reviewed. Constitutional: She appears well-developed and well-nourished.  HENT:  Head: Normocephalic and atraumatic.  Eyes: Conjunctivae are normal. Pupils are equal, round, and reactive to light.  Neck: Neck supple. No tracheal deviation present. No thyromegaly present.  Cardiovascular: Normal rate and regular rhythm.   No murmur heard. Pulmonary/Chest: Effort normal and breath sounds normal.  Abdominal: Soft. Bowel sounds are normal. She exhibits no distension. There is tenderness.  Tender at suprapubic area  Genitourinary:  Mildly tender at right flank  Musculoskeletal: Normal range of motion. She exhibits no edema and no tenderness.  Neurological: She is alert. Coordination normal.  Skin: Skin is warm and dry. No rash noted.  Psychiatric: She has a normal mood and affect.    ED Course  Procedures (including critical care time) Labs Review Labs Reviewed - No data to display  Imaging Review No results found.   EKG Interpretation None     11:45 PM patient feels much improved after treatment with intravenous morphine and Zofran. She is able  to drink without difficulty Results for orders placed during the hospital encounter of 04/19/14  URINALYSIS, ROUTINE W REFLEX MICROSCOPIC      Result Value Ref Range   Color, Urine ORANGE (*) YELLOW   APPearance CLOUDY (*) CLEAR   Specific Gravity, Urine 1.014  1.005 - 1.030   pH 6.0  5.0 - 8.0   Glucose, UA NEGATIVE  NEGATIVE mg/dL   Hgb urine dipstick LARGE (*) NEGATIVE   Bilirubin Urine NEGATIVE  NEGATIVE   Ketones, ur NEGATIVE  NEGATIVE mg/dL   Protein, ur >811>300 (*) NEGATIVE mg/dL   Urobilinogen, UA 0.2  0.0 - 1.0 mg/dL   Nitrite POSITIVE (*) NEGATIVE   Leukocytes, UA NEGATIVE  NEGATIVE  CBC      Result Value Ref Range   WBC 10.2  4.0 - 10.5 K/uL   RBC 3.95  3.87 - 5.11 MIL/uL   Hemoglobin 11.7 (*) 12.0 - 15.0 g/dL   HCT 36.5  36.0 - 46.0 %   MCV 92.4  78.0 - 100.0 fL   MCH 29.6  26.0 - 34.0 pg   MCHC 32.1  30.0 - 36.0 g/dL   RDW 45.413.1  09.811.5 - 11.915.5 %   Platelets 323  150 - 400 K/uL  URINE MICROSCOPIC-ADD ON      Result Value Ref Range   Squamous Epithelial / LPF RARE  RARE   WBC, UA 7-10  <3 WBC/hpf   RBC / HPF 21-50  <3 RBC/hpf   Bacteria, UA RARE  RARE   Urine-Other MUCOUS PRESENT    POC OCCULT BLOOD, ED      Result Value Ref Range   Fecal Occult Bld NEGATIVE  NEGATIVE  I-STAT CHEM 8, ED      Result Value Ref Range   Sodium 143  137 - 147 mEq/L   Potassium 3.8  3.7 - 5.3 mEq/L   Chloride 104  96 - 112 mEq/L   BUN 14  6 - 23 mg/dL   Creatinine, Ser 1.471.20 (*) 0.50 - 1.10 mg/dL   Glucose, Bld 829148 (*) 70 - 99 mg/dL   Calcium, Ion 5.621.24  1.301.13 - 1.30 mmol/L   TCO2 23  0 - 100 mmol/L   Hemoglobin 12.9  12.0 - 15.0 g/dL   HCT 86.538.0  78.436.0 - 69.646.0 %   No results found.  MDM  I suspect the patient suffering spasm as result of ureteral stent being pulled out today. Exhibits no evidence of rectal bleeding. Patient has Percocet and an antiemetic at home. Urine sent for culture. She has a scheduled with Dr. Margarita GrizzleWoodruff, her urologist scheduled for 04/21/2014 Diagnosis#1 ureteral  colic #2 renal insufficiency #3 hyperglycemia Final diagnoses:  None        Doug SouSam Antwan Pandya, MD 04/19/14 2350

## 2014-04-19 NOTE — ED Notes (Signed)
MD at bedside. 

## 2014-04-21 LAB — URINE CULTURE
Colony Count: NO GROWTH
Culture: NO GROWTH

## 2014-05-25 ENCOUNTER — Encounter (INDEPENDENT_AMBULATORY_CARE_PROVIDER_SITE_OTHER): Payer: Self-pay | Admitting: General Surgery

## 2014-09-08 ENCOUNTER — Encounter: Payer: Self-pay | Admitting: Neurology

## 2014-09-08 ENCOUNTER — Ambulatory Visit (INDEPENDENT_AMBULATORY_CARE_PROVIDER_SITE_OTHER): Payer: Medicare HMO | Admitting: Neurology

## 2014-09-08 VITALS — BP 146/75 | HR 77 | Ht 61.0 in | Wt 125.0 lb

## 2014-09-08 DIAGNOSIS — G44209 Tension-type headache, unspecified, not intractable: Secondary | ICD-10-CM

## 2014-09-08 DIAGNOSIS — R519 Headache, unspecified: Secondary | ICD-10-CM | POA: Insufficient documentation

## 2014-09-08 DIAGNOSIS — R51 Headache: Secondary | ICD-10-CM

## 2014-09-08 DIAGNOSIS — I671 Cerebral aneurysm, nonruptured: Secondary | ICD-10-CM

## 2014-09-08 MED ORDER — NORTRIPTYLINE HCL 10 MG PO CAPS: ORAL_CAPSULE | ORAL | Status: DC

## 2014-09-08 NOTE — Progress Notes (Signed)
PATIENT: Natalie Willis DOB: 07/15/1946  HISTORICAL  Natalie Willis is a 68 year old female, accompanied by her interpreter, she is native of [place], she is referred by her primary care physician Dr.Gretchen Lenda Kelp for evaluation of frequent headaches  She had history of headaches for couple years,getting worse since June 2015, she complains of right parietal area, sometimes holoacranial pressure headaches,lasting for few hours, aspirin, and sleep urinary help, she denies significant light noise sensitivity.  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   REVIEW OF SYSTEMS: Full 14 system review of systems performed and notable only for fatigue, wheezing, joint pain, swelling, cramps, blood in urine, allergy, runny nose, headaches, numbness, tremor, insomnia, sleepiness, not enough sleep, change in appetite  ALLERGIES: Allergies  Allergen Reactions  . Dilaudid [Hydromorphone Hcl] Nausea And Vomiting    Not an allergy, but pt appears sensitive to IV narcotics.     HOME MEDICATIONS: Current Outpatient Prescriptions on File Prior to Visit  Medication Sig Dispense Refill  . albuterol (PROVENTIL HFA;VENTOLIN HFA) 108 (90 BASE) MCG/ACT inhaler Inhale 2 puffs into the lungs every 6 (six) hours as needed for wheezing.    Marland Kitchen amLODipine (NORVASC) 5 MG tablet Take 5 mg by mouth daily.    . hydrocortisone (PROCTOZONE-HC) 2.5 % rectal cream Place 1 application rectally 2 (two) times daily. 30 g 1  . lisinopril (PRINIVIL,ZESTRIL) 40 MG tablet Take 40 mg by mouth daily.    . Multiple Vitamin (MULTIVITAMIN WITH  MINERALS) TABS tablet Take 1 tablet by mouth daily.    Marland Kitchen omeprazole (PRILOSEC) 20 MG capsule Take 20 mg by mouth daily.    . phenazopyridine (PYRIDIUM) 200 MG tablet Take 1 tablet (200 mg total) by mouth 3 (three) times daily as needed for pain. 30 tablet 6  . simvastatin (ZOCOR) 20 MG tablet Take 20 mg by mouth every evening.    . zolpidem (AMBIEN) 5 MG tablet Take 2.5-5 mg by mouth at bedtime as needed for sleep. Take 2.5 mg by mouth at bedtime as needed for sleep, may take an additional 2.5 mg if necessary     No current facility-administered medications on file prior to visit.    PAST MEDICAL HISTORY: Past Medical History  Diagnosis Date  . Hypertension   . Hematuria   . Right ureteral stone   . 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 Internal hemorrhoid   . Hyperlipidemia   . GERD (gastroesophageal reflux disease)   . HA (headache)     PAST SURGICAL HISTORY: Past Surgical History  Procedure Laterality Date  . Appendectomy    . Abdominal hysterectomy    . Colonoscopy  02-15-2012  . Cardiac catheterization  10/ 2009   DR  [facility]    NORMAL CORONARIES AND  LVSF  . Transthoracic echocardiogram  10-19-2008    NORMAL STUDY/  EF 55%  . Cystoscopy with retrograde pyelogram, ureteroscopy and stent placement Right 04/14/2014    Procedure: Derma, URETEROSCOPY AND STENT PLACEMENT;  Surgeon: Sharyn Creamer,  MD;  Location: [facility];  Service: Urology;  Laterality: Right;    FAMILY HISTORY: Family History  Problem Relation Age of Onset  . Heart attack Father     SOCIAL HISTORY:  History   Social History  . Marital Status: Legally Separated    Spouse Name: N/A    Number of Children: 2  . Years of Education: N/A   Occupational History  . Stay at home   Social History Main Topics  . Smoking status: Never Smoker   . Smokeless tobacco: Never Used  . Alcohol Use: No   . Drug Use: No  . Sexual Activity: Not on file   Other Topics Concern  . Not on file   Social History Narrative     PHYSICAL EXAM   Filed Vitals:   09/08/14 1547  BP: 146/75  Pulse: 77  Height: 5' 1" (1.549 m)  Weight: 125 lb (56.7 kg)    Not recorded      Body mass index is 23.63 kg/(m^2).   Generalized: In no acute distress  Neck: Supple, no carotid bruits   Cardiac: Regular rate rhythm  Pulmonary: Clear to auscultation bilaterally  Musculoskeletal: No deformity  Neurological examination  Mentation: Alert oriented to time, place, history taking, and causual conversation  Cranial nerve II-XII: Pupils were equal round reactive to light. Extraocular movements were full.  Visual field were full on confrontational test. Bilateral fundi were sharp.  Facial sensation and strength were normal. Hearing was intact to finger rubbing bilaterally. Uvula tongue midline.  Head turning and shoulder shrug and were normal and symmetric.Tongue protrusion into cheek strength was normal.  Motor: Normal tone, bulk and strength.  Sensory: Intact to fine touch, pinprick, preserved vibratory sensation, and proprioception at toes.  Coordination: Normal finger to nose, heel-to-shin bilaterally there was no truncal ataxia  Gait: Rising up from seated position without assistance, normal stance, without trunk ataxia, moderate stride, good arm swing, smooth turning, able to perform tiptoe, and heel walking without difficulty.   Romberg signs: Negative  Deep tendon reflexes: Brachioradialis 2/2, biceps 2/2, triceps 2/2, patellar 2/2, Achilles 2/2, plantar responses were flexor bilaterally.   DIAGNOSTIC DATA (LABS, IMAGING, TESTING) - I reviewed patient records, labs, notes, testing and imaging myself where available.  Lab Results  Component Value Date   WBC 10.2 04/19/2014   HGB 12.9 04/19/2014   HCT 38.0 04/19/2014   MCV 92.4 04/19/2014   PLT 323 04/19/2014      Component  Value Date/Time   NA 143 04/19/2014 2316   K 3.8 04/19/2014 2316   CL 104 04/19/2014 2316   CO2 24 02/16/2014 0900   GLUCOSE 148* 04/19/2014 2316   BUN 14 04/19/2014 2316   CREATININE 1.20* 04/19/2014 2316   CALCIUM 9.3 02/16/2014 0900   PROT 7.7 02/16/2014 0900   ALBUMIN 4.4 02/16/2014 0900   AST 19 02/16/2014 0900   ALT 15 02/16/2014 0900   ALKPHOS 75 02/16/2014 0900   BILITOT 0.2* 02/16/2014 0900   GFRNONAA 66* 02/16/2014 0900   GFRAA 76* 02/16/2014 0900   Lab Results  Component Value Date   CHOL  10/18/2008    161        ATP III CLASSIFICATION:  <200     mg/dL   Desirable  200-239  mg/dL   Borderline High  >=240    mg/dL   High   HDL 40 10/18/2008   LDLCALC * 10/18/2008    102  Total Cholesterol/HDL:CHD Risk Coronary Heart Disease Risk Table                     Men   Women  1/2 Average Risk   3.4   3.3   TRIG 95 10/18/2008   CHOLHDL 4.0 10/18/2008   Lab Results  Component Value Date   HGBA1C  10/18/2008    5.8 (NOTE)   The ADA recommends the following therapeutic goal for glycemic   control related to Hgb A1C measurement:   Goal of Therapy:   < 7.0% Hgb A1C   Reference: American Diabetes Association: Clinical Practice   Recommendations 2008, Diabetes Care,  2008, 31:(Suppl 1).   No results found for: VITAMINB12 No results found for: TSH    ASSESSMENT AND PLAN  Natalie Willis is a 68 y.o. female complains of  frequent headaches, she denies jaw claudication, but complains of multiple joints pain, muscle cramps, 4 mm left periophthalmic artery aneurysm  1. Complete evaluation with repeat MRA of the brain 2. ESR C-reactive protein to rule out temporal arteritis 3. Nortriptyline, 10 mg titrating to 20 mg every night as headache prevention    Marcial Pacas, M.D. Ph.D.  [facility] [address], [place], [place] [address] [phone]

## 2014-09-09 LAB — C-REACTIVE PROTEIN: CRP: 0.4 mg/L (ref 0.0–4.9)

## 2014-09-09 LAB — TSH: TSH: 1.72 u[IU]/mL (ref 0.450–4.500)

## 2014-09-09 LAB — SEDIMENTATION RATE: Sed Rate: 5 mm/hr (ref 0–40)

## 2014-09-10 NOTE — Progress Notes (Signed)
Quick Note:  Called patient daughter and spoke to her relayed Normal MRI. ______

## 2014-09-22 ENCOUNTER — Ambulatory Visit
Admission: RE | Admit: 2014-09-22 | Discharge: 2014-09-22 | Disposition: A | Discharge status: Home / Self Care | Payer: Medicare HMO | Source: Ambulatory Visit | Attending: Neurology | Admitting: Neurology

## 2014-09-22 DIAGNOSIS — G44209 Tension-type headache, unspecified, not intractable: Secondary | ICD-10-CM

## 2014-09-22 DIAGNOSIS — I671 Cerebral aneurysm, nonruptured: Secondary | ICD-10-CM

## 2014-09-23 ENCOUNTER — Telehealth: Payer: Self-pay | Admitting: Neurology

## 2014-09-23 NOTE — Telephone Encounter (Signed)
I have called her MRA report  Abnormal MRA head (without) demonstrating: 1. 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 2. 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 Associated hypoplastic vertebral and basilar arteries. 3. No change from MRA on 07/02/14.

## 2014-10-11 ENCOUNTER — Ambulatory Visit: Payer: Medicare HMO | Admitting: Neurology

## 2014-12-07 ENCOUNTER — Ambulatory Visit (INDEPENDENT_AMBULATORY_CARE_PROVIDER_SITE_OTHER): Payer: Medicare HMO | Admitting: Neurology

## 2014-12-07 VITALS — BP 132/64 | HR 72 | Ht 61.0 in | Wt 121.0 lb

## 2014-12-07 DIAGNOSIS — I671 Cerebral aneurysm, nonruptured: Secondary | ICD-10-CM

## 2014-12-07 DIAGNOSIS — G44209 Tension-type headache, unspecified, not intractable: Secondary | ICD-10-CM

## 2014-12-07 NOTE — Progress Notes (Signed)
PATIENT: Natalie Willis DOB: 05-27-1946  HISTORICAL  Natalie Willis is a 69 year old female, accompanied by her interpreter, she is native of [place], she is referred by her primary care physician Dr.Gretchen Lenda Kelp for evaluation of frequent headaches  She had history of headaches for couple years,getting worse since June 2015, she complains of right parietal area, sometimes holoacranial pressure headaches,lasting for few hours, aspirin, and sleep urinary help, she denies significant light noise sensitivity.  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  UPDATE Feb 2nd 2016: She is with her friend at today's clinical visit, her headache overall has much improved, ESR C-reactive protein was normal, Repeat MRA of brain in December 2015 showed no change, stable left para-ophthalmic cerebral aneurysm (8m) projecting superiorly. 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 Associated hypoplastic vertebral and basilar arteries.  She complains of insomnia today, Ambien used to works well, her primary care physician has started Zoloft a week ago,    REVIEW OF SYSTEMS: Full 14 system review of systems performed and notable only for as above  ALLERGIES: Allergies  Allergen Reactions  . Dilaudid [Hydromorphone Hcl] Nausea And Vomiting    Not an allergy, but pt appears sensitive to IV narcotics.   . Hydromorphone Nausea And Vomiting    Not an allergy, but pt appears sensitive to IV narcotics.     HOME MEDICATIONS: Current Outpatient Prescriptions on File Prior to  Visit  Medication Sig Dispense Refill  . albuterol (PROVENTIL HFA;VENTOLIN HFA) 108 (90 BASE) MCG/ACT inhaler Inhale 2 puffs into the lungs every 6 (six) hours as needed for wheezing.    .Marland KitchenamLODipine (NORVASC) 5 MG tablet Take 5 mg by mouth daily.    . hydrocortisone (PROCTOZONE-HC) 2.5 % rectal cream Place 1 application rectally 2 (two) times daily. 30 g 1  . lisinopril (PRINIVIL,ZESTRIL) 40 MG tablet Take 40 mg by mouth daily.    . Multiple Vitamin (MULTIVITAMIN WITH MINERALS) TABS tablet Take 1 tablet by mouth daily.    .Marland Kitchenomeprazole (PRILOSEC) 20 MG capsule Take 20 mg by mouth daily.    . phenazopyridine (PYRIDIUM) 200 MG tablet Take 1 tablet (200 mg total) by mouth 3 (three) times daily as needed for pain. 30 tablet 6  . simvastatin (ZOCOR) 20 MG tablet Take 20 mg by mouth every evening.     No current facility-administered medications on file prior to visit.    PAST MEDICAL HISTORY: Past Medical History  Diagnosis Date  . Hypertension   . Hematuria   . Right ureteral stone   . 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 Internal hemorrhoid   . Hyperlipidemia   . GERD (gastroesophageal reflux disease)   . HA (headache)     PAST SURGICAL HISTORY: Past Surgical History  Procedure Laterality Date  . Appendectomy    . Abdominal hysterectomy    . Colonoscopy  02-15-2012  . [facility]  10/ 2009   [facility]   NORMAL CORONARIES AND  LVSF  .  Transthoracic echocardiogram  10-19-2008    NORMAL STUDY/  EF 55%  . Cystoscopy with retrograde pyelogram, ureteroscopy and stent placement Right 04/14/2014    Procedure: CYSTOSCOPY WITH RETROGRADE PYELOGRAM, URETEROSCOPY AND STENT PLACEMENT;  Surgeon: Sharyn Creamer, MD;  Location: [facility];  Service: Urology;  Laterality: Right;    FAMILY HISTORY: Family History  Problem Relation Age of Onset  . Heart attack Father     SOCIAL HISTORY:  History    Social History  . Marital Status: Legally Separated    Spouse Name: N/A    Number of Children: 2  . Years of Education: N/A   Occupational History  . Stay at home   Social History Main Topics  . Smoking status: Never Smoker   . Smokeless tobacco: Never Used  . Alcohol Use: No  . Drug Use: No  . Sexual Activity: Not on file   Other Topics Concern  . Not on file   Social History Narrative     PHYSICAL EXAM   Filed Vitals:   12/07/14 1023  BP: 132/64  Pulse: 72  Height: _0  (1.549 m)  Weight: 121 lb (54.885 kg)    Not recorded      Body mass index is 22.87 kg/(m^2).   Generalized: In no acute distress  Neck: Supple, no carotid bruits   Cardiac: Regular rate rhythm  Pulmonary: Clear to auscultation bilaterally  Musculoskeletal: No deformity  Neurological examination  Mentation: Alert oriented to time, place, history taking, and causual conversation  Cranial nerve II-XII: Pupils were equal round reactive to light. Extraocular movements were full.  Visual field were full on confrontational test. Bilateral fundi were sharp.  Facial sensation and strength were normal. Hearing was intact to finger rubbing bilaterally. Uvula tongue midline.  Head turning and shoulder shrug and were normal and symmetric.Tongue protrusion into cheek strength was normal.  Motor: Normal tone, bulk and strength.  Sensory: Intact to fine touch, pinprick, preserved vibratory sensation, and proprioception at toes.  Coordination: Normal finger to nose, heel-to-shin bilaterally there was no truncal ataxia  Gait: Rising up from seated position without assistance, normal stance, without trunk ataxia, moderate stride, good arm swing, smooth turning, able to perform tiptoe, and heel walking without difficulty.   Romberg signs: Negative  Deep tendon reflexes: Brachioradialis 2/2, biceps 2/2, triceps 2/2, patellar 2/2, Achilles 2/2, plantar responses were flexor  bilaterally.   DIAGNOSTIC DATA (LABS, IMAGING, TESTING) - I reviewed patient records, labs, notes, testing and imaging myself where available.  Lab Results  Component Value Date   WBC 10.2 04/19/2014   HGB 12.9 04/19/2014   HCT 38.0 04/19/2014   MCV 92.4 04/19/2014   PLT 323 04/19/2014      Component Value Date/Time   NA 143 04/19/2014 2316   K 3.8 04/19/2014 2316   CL 104 04/19/2014 2316   CO2 24 02/16/2014 0900   GLUCOSE 148* 04/19/2014 2316   BUN 14 04/19/2014 2316   CREATININE 1.20* 04/19/2014 2316   CALCIUM 9.3 02/16/2014 0900   PROT 7.7 02/16/2014 0900   ALBUMIN 4.4 02/16/2014 0900   AST 19 02/16/2014 0900   ALT 15 02/16/2014 0900   ALKPHOS 75 02/16/2014 0900   BILITOT 0.2* 02/16/2014 0900   GFRNONAA 66* 02/16/2014 0900   GFRAA 76* 02/16/2014 0900   Lab Results  Component Value Date   CHOL  10/18/2008    161        ATP III CLASSIFICATION:  <200  mg/dL   Desirable  200-239  mg/dL   Borderline High  >=240    mg/dL   High   HDL 40 10/18/2008   LDLCALC * 10/18/2008    102        Total Cholesterol/HDL:CHD Risk Coronary Heart Disease Risk Table                     Men   Women  1/2 Average Risk   3.4   3.3   TRIG 95 10/18/2008   CHOLHDL 4.0 10/18/2008   Lab Results  Component Value Date   HGBA1C  10/18/2008    5.8 (NOTE)   The ADA recommends the following therapeutic goal for glycemic   control related to Hgb A1C measurement:   Goal of Therapy:   < 7.0% Hgb A1C   Reference: American Diabetes Association: Clinical Practice   Recommendations 2008, Diabetes Care,  2008, 31:(Suppl 1).   No results found for: VITAMINB12 Lab Results  Component Value Date   TSH 1.720 09/08/2014      ASSESSMENT AND PLAN  Natalie Willis is a 69 y.o. female complains of  frequent headaches, she denies jaw claudication, but complains of multiple joints pain, muscle cramps, 4 mm left periophthalmic artery aneurysm, Insomnia   Her headaches are consistent with tension  headaches, overall has much improved, Asymptomatic, stable, small size, 4 mm left periophthalmic artery aneurysm, no change at repeat MRI a of brain,   continue follow-up with her primary care physician, return to clinic for new issues.  No orders of the defined types were placed in this encounter.    New Prescriptions   No medications on file    Medications Discontinued During This Encounter  Medication Reason  . nortriptyline (PAMELOR) 10 MG capsule Patient Preference  . tamsulosin (FLOMAX) 0.4 MG CAPS capsule Patient Preference  . zolpidem (AMBIEN) 5 MG tablet Patient Preference    Return if symptoms worsen or fail to improve.      Marcial Pacas, M.D. Ph.D.  [facility] [address], [place], [place] [address] [phone]

## 2015-05-01 ENCOUNTER — Encounter (HOSPITAL_BASED_OUTPATIENT_CLINIC_OR_DEPARTMENT_OTHER): Payer: Self-pay | Admitting: *Deleted

## 2015-05-01 ENCOUNTER — Emergency Department (HOSPITAL_BASED_OUTPATIENT_CLINIC_OR_DEPARTMENT_OTHER): Payer: Medicare HMO

## 2015-05-01 ENCOUNTER — Emergency Department (HOSPITAL_BASED_OUTPATIENT_CLINIC_OR_DEPARTMENT_OTHER)
Admission: EM | Admit: 2015-05-01 | Discharge: 2015-05-01 | Disposition: A | Discharge status: Home / Self Care | Payer: Medicare HMO | Attending: Emergency Medicine | Admitting: Emergency Medicine

## 2015-05-01 DIAGNOSIS — I10 Essential (primary) hypertension: Secondary | ICD-10-CM | POA: Diagnosis not present

## 2015-05-01 DIAGNOSIS — Z87442 Personal history of urinary calculi: Secondary | ICD-10-CM | POA: Insufficient documentation

## 2015-05-01 DIAGNOSIS — E785 Hyperlipidemia, unspecified: Secondary | ICD-10-CM | POA: Insufficient documentation

## 2015-05-01 DIAGNOSIS — Z79899 Other long term (current) drug therapy: Secondary | ICD-10-CM | POA: Insufficient documentation

## 2015-05-01 DIAGNOSIS — R519 Headache, unspecified: Secondary | ICD-10-CM

## 2015-05-01 DIAGNOSIS — R51 Headache: Secondary | ICD-10-CM | POA: Insufficient documentation

## 2015-05-01 DIAGNOSIS — R110 Nausea: Secondary | ICD-10-CM | POA: Diagnosis not present

## 2015-05-01 DIAGNOSIS — Z7952 Long term (current) use of systemic steroids: Secondary | ICD-10-CM | POA: Insufficient documentation

## 2015-05-01 LAB — BASIC METABOLIC PANEL
Anion gap: 13 (ref 5–15)
BUN: 13 mg/dL (ref 6–20)
CO2: 27 mmol/L (ref 22–32)
Calcium: 9.9 mg/dL (ref 8.9–10.3)
Chloride: 104 mmol/L (ref 101–111)
Creatinine, Ser: 0.73 mg/dL (ref 0.44–1.00)
GFR calc Af Amer: >60 mL/min (ref >60)
GFR calc non Af Amer: >60 mL/min (ref >60)
GLUCOSE: 101 mg/dL — AB (ref 65–99)
POTASSIUM: 4 mmol/L (ref 3.5–5.1)
Sodium: 144 mmol/L (ref 135–145)

## 2015-05-01 LAB — CBC WITH DIFFERENTIAL/PLATELET
BASOS PCT: 0 % (ref 0–1)
Basophils Absolute: 0 10*3/uL (ref 0.0–0.1)
EOS PCT: 3 % (ref 0–5)
Eosinophils Absolute: 0.2 10*3/uL (ref 0.0–0.7)
HEMATOCRIT: 41 % (ref 36.0–46.0)
Hemoglobin: 13 g/dL (ref 12.0–15.0)
LYMPHS ABS: 3 10*3/uL (ref 0.7–4.0)
Lymphocytes Relative: 44 % (ref 12–46)
MCH: 29.7 pg (ref 26.0–34.0)
MCHC: 31.7 g/dL (ref 30.0–36.0)
MCV: 93.8 fL (ref 78.0–100.0)
Monocytes Absolute: 0.4 10*3/uL (ref 0.1–1.0)
Monocytes Relative: 6 % (ref 3–12)
NEUTROS ABS: 3.1 10*3/uL (ref 1.7–7.7)
NEUTROS PCT: 47 % (ref 43–77)
PLATELETS: 297 10*3/uL (ref 150–400)
RBC: 4.37 MIL/uL (ref 3.87–5.11)
RDW: 13 % (ref 11.5–15.5)
WBC: 6.7 10*3/uL (ref 4.0–10.5)

## 2015-05-01 MED ORDER — DIPHENHYDRAMINE HCL 50 MG/ML IJ SOLN
12.5000 mg | Freq: Once | INTRAMUSCULAR | Status: AC
  Administered 2015-05-01: 12.5 mg via INTRAVENOUS
  Filled 2015-05-01: qty 1

## 2015-05-01 MED ORDER — AMLODIPINE BESYLATE 5 MG PO TABS
5.0000 mg | ORAL_TABLET | Freq: Every day | ORAL | Status: DC
  Administered 2015-05-01: 5 mg via ORAL
  Filled 2015-05-01: qty 1

## 2015-05-01 MED ORDER — KETOROLAC TROMETHAMINE 30 MG/ML IJ SOLN
15.0000 mg | Freq: Once | INTRAMUSCULAR | Status: AC
  Administered 2015-05-01: 15 mg via INTRAVENOUS
  Filled 2015-05-01: qty 1

## 2015-05-01 MED ORDER — LISINOPRIL 10 MG PO TABS
40.0000 mg | ORAL_TABLET | Freq: Every day | ORAL | Status: DC
  Administered 2015-05-01: 40 mg via ORAL
  Filled 2015-05-01: qty 4

## 2015-05-01 MED ORDER — PROCHLORPERAZINE EDISYLATE 5 MG/ML IJ SOLN
10.0000 mg | Freq: Four times a day (QID) | INTRAMUSCULAR | Status: DC | PRN
  Administered 2015-05-01: 10 mg via INTRAVENOUS
  Filled 2015-05-01: qty 2

## 2015-05-01 NOTE — ED Notes (Signed)
Patient c/o extreme head pressure that has grown worse over the past week, took tylenol last night around 2300 no relief

## 2015-05-01 NOTE — ED Provider Notes (Signed)
CSN: [id number]     Arrival date & time 05/01/15  0719 History   First MD Initiated Contact with Patient 05/01/15 [phone]     Chief Complaint  Patient presents with  . Headache     (Consider location/radiation/quality/duration/timing/severity/associated sxs/prior Treatment) HPI Patient has had increasing pressure in her head for the last week.  Took Tylenol last night with no relief.  Said nausea along with it.  Has not taken her blood pressure medicines morning.  Denies any fever.  No visual problems.  Does have history of aneurysm of the ophthalmic artery.  Head discomfort.  was gradual in onset. Past Medical History  Diagnosis Date  . Hypertension   . Hematuria   . Right ureteral stone   . 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 Internal hemorrhoid   . Hyperlipidemia   . GERD (gastroesophageal reflux disease)   . HA (headache)    Past Surgical History  Procedure Laterality Date  . Appendectomy    . Abdominal hysterectomy    . Colonoscopy  02-15-2012  . Cardiac catheterization  10/ 2009   DR  [facility]    NORMAL CORONARIES AND  LVSF  . Transthoracic echocardiogram  10-19-2008    NORMAL STUDY/  EF 55%  . Cystoscopy with retrograde pyelogram, ureteroscopy and stent placement Right 04/14/2014    Procedure: CYSTOSCOPY WITH RETROGRADE PYELOGRAM, URETEROSCOPY AND STENT PLACEMENT;  Surgeon: Magdalene Molly, MD;  Location: [facility];  Service: Urology;  Laterality: Right;   Family History  Problem Relation Age of Onset  . Heart attack Father    History  Substance Use Topics  . Smoking status: Never Smoker   . Smokeless tobacco: Never Used  . Alcohol Use: No   OB History    No data available     Review of Systems  Unable to perform ROS: Other      Allergies  Dilaudid and Hydromorphone  Home Medications   Prior to Admission medications   Medication Sig Start Date End Date Taking? Authorizing  Provider  albuterol (PROVENTIL HFA;VENTOLIN HFA) 108 (90 BASE) MCG/ACT inhaler Inhale 2 puffs into the lungs every 6 (six) hours as needed for wheezing.    Historical Provider, MD  amLODipine (NORVASC) 5 MG tablet Take 5 mg by mouth daily.    Historical Provider, MD  hydrocortisone (PROCTOZONE-HC) 2.5 % rectal cream Place 1 application rectally 2 (two) times daily. 02/09/14   Romie Levee, MD  lisinopril (PRINIVIL,ZESTRIL) 40 MG tablet Take 40 mg by mouth daily.    Historical Provider, MD  Multiple Vitamin (MULTIVITAMIN WITH MINERALS) TABS tablet Take 1 tablet by mouth daily.    Historical Provider, MD  omeprazole (PRILOSEC) 20 MG capsule Take 20 mg by mouth daily.    Historical Provider, MD  phenazopyridine (PYRIDIUM) 200 MG tablet Take 1 tablet (200 mg total) by mouth 3 (three) times daily as needed for pain. 04/14/14   Natalia Leatherwood, MD  simvastatin (ZOCOR) 20 MG tablet Take 20 mg by mouth every evening.    Historical Provider, MD   BP 129/63 mmHg  Pulse 77  Temp(Src) 98.3 F (36.8 C) (Oral)  Resp 18  Ht 5\' 1"  (1.549 m)  Wt 125 lb (56.7 kg)  BMI 23.63 kg/m2  SpO2 98% Physical Exam  Constitutional: She is oriented to person, place, and time. She appears well-developed and well-nourished. No distress.  HENT:  Head: Normocephalic and atraumatic.  Eyes: Pupils are equal, round, and reactive to light.  Neck: Normal range of motion.  Cardiovascular: Normal rate and intact distal pulses.   Pulmonary/Chest: No respiratory distress.  Abdominal: Normal appearance. She exhibits no distension.  Musculoskeletal: Normal range of motion.  Neurological: She is alert and oriented to person, place, and time. No cranial nerve deficit. GCS eye subscore is 4. GCS verbal subscore is 5. GCS motor subscore is 6.  Skin: Skin is warm and dry. No rash noted.  Psychiatric: She has a normal mood and affect. Her behavior is normal.  Nursing note and vitals reviewed.   ED Course  Procedures (including  critical care time)  Medications  prochlorperazine (COMPAZINE) injection 10 mg (10 mg Intravenous Given 05/01/15 0809)  amLODipine (NORVASC) tablet 5 mg (5 mg Oral Given 05/01/15 0845)  lisinopril (PRINIVIL,ZESTRIL) tablet 40 mg (40 mg Oral Given 05/01/15 0846)  ketorolac (TORADOL) 30 MG/ML injection 15 mg (15 mg Intravenous Given 05/01/15 0811)  diphenhydrAMINE (BENADRYL) injection 12.5 mg (12.5 mg Intravenous Given 05/01/15 0810)    Labs Review Labs Reviewed  BASIC METABOLIC PANEL - Abnormal; Notable for the following:    Glucose, Bld 101 (*)    All other components within normal limits  CBC WITH DIFFERENTIAL/PLATELET    Imaging Review Ct Head Wo Contrast  05/01/2015   CLINICAL DATA:  Extreme head pressure during the past week. History of ophthalmic artery aneurysm  EXAM: CT HEAD WITHOUT CONTRAST  TECHNIQUE: Contiguous axial images were obtained from the base of the skull through the vertex without intravenous contrast.  COMPARISON:  10/18/2008; brain MRI - 07/02/2013  FINDINGS: Similar findings of mild atrophy with sulcal prominence. Gray-white differentiation is maintained. No CT evidence of acute large territory infarct. No intraparenchymal or extra-axial mass or hemorrhage. Normal size a configuration of the ventricles and basilar cisterns. No midline shift. Intracranial atherosclerosis. Limited visualization the paranasal sinuses and mastoid air cells is normal. No air-fluid levels. Regional soft tissues appear normal. No displaced calvarial fracture.  IMPRESSION: Negative noncontrast head CT.   Electronically Signed   By: Simonne Come M.D.   On: 05/01/2015 08:35     EKG Interpretation None     After treatment in the ED the patient feels back to baseline and wants to go home.  MDM   Final diagnoses:  Headache        Nelva Nay, MD 05/01/15 1056

## 2015-05-01 NOTE — Discharge Instructions (Signed)
Cefalea migrañosa °(Migraine Headache) °Una cefalea migrañosa es un dolor intenso y punzante en uno o ambos lados de la cabeza. La migraña puede durar desde 30 minutos hasta varias horas. °CAUSAS  °No siempre se conoce la causa exacta de la cefalea migrañosa. Sin embargo, pueden aparecer cuando los nervios del cerebro se irritan y liberan ciertas sustancias químicas que causan inflamación. Esto ocasiona dolor. °Existen también ciertos factores que pueden desencadenar las migrañas, como los siguientes: °· Alcohol. °· Fumar. °· Estrés. °· La menstruación °· Quesos añejados. °· Los alimentos o las bebidas que contienen nitratos, glutamato, aspartamo o tiramina. °· Falta de sueño. °· Chocolate. °· Cafeína. °· Hambre. °· Actividad física extenuante. °· Fatiga. °· Medicamentos que se usan para tratar el dolor en el pecho (nitroglicerina), píldoras anticonceptivas, estrógeno y algunos medicamentos para la hipertensión arterial. °SIGNOS Y SÍNTOMAS °· Dolor en uno o ambos lados de la cabeza. °· Dolor pulsante o punzante. °· Dolor intenso que impide realizar las actividades diarias. °· Dolor que se agrava por cualquier actividad física. °· Náuseas, vómitos o ambos. °· Mareos. °· Dolor con la exposición a las luces brillantes, a los ruidos fuertes o la actividad. °· Sensibilidad general a las luces brillantes, a los ruidos fuertes o a los olores. °Antes de sufrir una migraña, puede recibir señales de advertencia (aura). Un aura puede incluir: °· Ver las luces intermitentes. °· Ver puntos brillantes, halos o líneas en zigzag. °· Tener una visión en túnel o visión borrosa. °· Sensación de entumecimiento u hormigueo. °· Dificultad para hablar °· Debilidad muscular. °DIAGNÓSTICO  °La cefalea migrañosa se diagnostica en función de lo siguiente: °· Síntomas. °· Examen físico. °· Una TC (tomografía computada) o resonancia magnética de la cabeza. Estas pruebas de diagnóstico por imagen no pueden diagnosticar las migrañas, pero pueden  ayudar a descartar otras causas de las cefaleas. °TRATAMIENTO °Le prescribirán medicamentos para aliviar el dolor y las náuseas. También podrán administrarse medicamentos para ayudar a prevenir las migrañas recurrentes.  °INSTRUCCIONES PARA EL CUIDADO EN EL HOGAR °· Sólo tome medicamentos de venta libre o recetados para calmar el dolor o el malestar, según las indicaciones de su médico. No se recomienda usar los opiáceos a largo plazo. °· Cuando tenga la migraña, acuéstese en un cuarto oscuro y tranquilo °· Lleve un registro diario para averiguar lo que puede provocar las cefaleas migrañosas. Por ejemplo, escriba: °¨ Lo que usted come y bebe. °¨ Cuánto tiempo duerme. °¨ Algún cambio en su dieta o en los medicamentos. °· Limite el consumo de bebidas alcohólicas. °· Si fuma, deje de hacerlo. °· Duerma entre 7 y 9 horas, o según las recomendaciones del médico. °· Limite el estrés. °· Mantenga las luces tenues si le molestan las luces brillantes y la migraña empeora. °SOLICITE ATENCIÓN MÉDICA DE INMEDIATO SI:  °· La migraña se hace cada vez más intensa. °· Tiene fiebre. °· Presenta rigidez en el cuello. °· Tiene pérdida de visión. °· Presenta debilidad muscular o pérdida del control muscular. °· Comienza a perder el equilibrio o tiene problemas para caminar. °· Sufre mareos o se desmaya. °· Tiene síntomas graves que son diferentes a los primeros síntomas. °ASEGÚRESE DE QUE:  °· Comprende estas instrucciones. °· Controlará su afección. °· Recibirá ayuda de inmediato si no mejora o si empeora. °Document Released: 10/22/2005 Document Revised: 08/12/2013 °ExitCare® Patient Information ©2015 [employer]. This information is not intended to replace advice given to you by your health care provider. Make sure you discuss any questions you have with your   health care provider. ° ° °

## 2015-09-26 ENCOUNTER — Other Ambulatory Visit: Payer: Self-pay | Admitting: Neurology

## 2015-11-23 ENCOUNTER — Ambulatory Visit
Admission: RE | Admit: 2015-11-23 | Discharge: 2015-11-23 | Disposition: A | Discharge status: Home / Self Care | Payer: Commercial Managed Care - HMO | Source: Ambulatory Visit | Attending: Cardiovascular Disease | Admitting: Cardiovascular Disease

## 2015-11-23 ENCOUNTER — Other Ambulatory Visit: Payer: Self-pay | Admitting: Cardiovascular Disease

## 2015-11-23 DIAGNOSIS — R059 Cough, unspecified: Secondary | ICD-10-CM

## 2015-11-23 DIAGNOSIS — R062 Wheezing: Secondary | ICD-10-CM

## 2015-11-23 DIAGNOSIS — R05 Cough: Secondary | ICD-10-CM

## 2015-12-29 ENCOUNTER — Ambulatory Visit
Admission: RE | Admit: 2015-12-29 | Discharge: 2015-12-29 | Disposition: A | Discharge status: Home / Self Care | Payer: Commercial Managed Care - HMO | Source: Ambulatory Visit | Attending: Cardiovascular Disease | Admitting: Cardiovascular Disease

## 2015-12-29 ENCOUNTER — Other Ambulatory Visit: Payer: Self-pay | Admitting: Cardiovascular Disease

## 2015-12-29 DIAGNOSIS — R1031 Right lower quadrant pain: Secondary | ICD-10-CM

## 2016-04-22 ENCOUNTER — Encounter (HOSPITAL_BASED_OUTPATIENT_CLINIC_OR_DEPARTMENT_OTHER): Payer: Self-pay | Admitting: Emergency Medicine

## 2016-04-22 ENCOUNTER — Emergency Department (HOSPITAL_BASED_OUTPATIENT_CLINIC_OR_DEPARTMENT_OTHER)
Admission: EM | Admit: 2016-04-22 | Discharge: 2016-04-23 | Disposition: A | Discharge status: Home / Self Care | Payer: Medicare HMO | Attending: Emergency Medicine | Admitting: Emergency Medicine

## 2016-04-22 ENCOUNTER — Emergency Department (HOSPITAL_BASED_OUTPATIENT_CLINIC_OR_DEPARTMENT_OTHER): Payer: Medicare HMO

## 2016-04-22 DIAGNOSIS — E785 Hyperlipidemia, unspecified: Secondary | ICD-10-CM | POA: Insufficient documentation

## 2016-04-22 DIAGNOSIS — R200 Anesthesia of skin: Secondary | ICD-10-CM | POA: Insufficient documentation

## 2016-04-22 DIAGNOSIS — H9209 Otalgia, unspecified ear: Secondary | ICD-10-CM | POA: Insufficient documentation

## 2016-04-22 DIAGNOSIS — R51 Headache: Secondary | ICD-10-CM | POA: Diagnosis present

## 2016-04-22 DIAGNOSIS — Z79899 Other long term (current) drug therapy: Secondary | ICD-10-CM | POA: Insufficient documentation

## 2016-04-22 DIAGNOSIS — I10 Essential (primary) hypertension: Secondary | ICD-10-CM | POA: Insufficient documentation

## 2016-04-22 DIAGNOSIS — G43809 Other migraine, not intractable, without status migrainosus: Secondary | ICD-10-CM | POA: Diagnosis not present

## 2016-04-22 LAB — CBC WITH DIFFERENTIAL/PLATELET
BASOS ABS: 0 10*3/uL (ref 0.0–0.1)
Basophils Relative: 0 %
Eosinophils Absolute: 0.1 10*3/uL (ref 0.0–0.7)
Eosinophils Relative: 1 %
HCT: 41.7 % (ref 36.0–46.0)
Hemoglobin: 13.5 g/dL (ref 12.0–15.0)
LYMPHS ABS: 2.5 10*3/uL (ref 0.7–4.0)
LYMPHS PCT: 34 %
MCH: 30.3 pg (ref 26.0–34.0)
MCHC: 32.4 g/dL (ref 30.0–36.0)
MCV: 93.5 fL (ref 78.0–100.0)
MONO ABS: 0.5 10*3/uL (ref 0.1–1.0)
Monocytes Relative: 6 %
Neutro Abs: 4.3 10*3/uL (ref 1.7–7.7)
Neutrophils Relative %: 59 %
PLATELETS: 334 10*3/uL (ref 150–400)
RBC: 4.46 MIL/uL (ref 3.87–5.11)
RDW: 14.5 % (ref 11.5–15.5)
WBC: 7.4 10*3/uL (ref 4.0–10.5)

## 2016-04-22 LAB — BASIC METABOLIC PANEL
Anion gap: 10 (ref 5–15)
BUN: 15 mg/dL (ref 6–20)
CALCIUM: 9.5 mg/dL (ref 8.9–10.3)
CO2: 25 mmol/L (ref 22–32)
CREATININE: 0.85 mg/dL (ref 0.44–1.00)
Chloride: 106 mmol/L (ref 101–111)
GFR calc Af Amer: >60 mL/min (ref >60)
GFR calc non Af Amer: >60 mL/min (ref >60)
GLUCOSE: 139 mg/dL — AB (ref 65–99)
Potassium: 3.7 mmol/L (ref 3.5–5.1)
Sodium: 141 mmol/L (ref 135–145)

## 2016-04-22 MED ORDER — DIPHENHYDRAMINE HCL 50 MG/ML IJ SOLN
12.5000 mg | Freq: Once | INTRAMUSCULAR | Status: AC
  Administered 2016-04-22: 12.5 mg via INTRAVENOUS
  Filled 2016-04-22: qty 1

## 2016-04-22 MED ORDER — PROCHLORPERAZINE EDISYLATE 5 MG/ML IJ SOLN
10.0000 mg | Freq: Once | INTRAMUSCULAR | Status: AC
  Administered 2016-04-22: 8 mg via INTRAVENOUS
  Filled 2016-04-22: qty 2

## 2016-04-22 MED ORDER — SODIUM CHLORIDE 0.9 % IV BOLUS (SEPSIS)
1000.0000 mL | Freq: Once | INTRAVENOUS | Status: AC
  Administered 2016-04-22: 1000 mL via INTRAVENOUS

## 2016-04-22 MED ORDER — SODIUM CHLORIDE 0.9 % IV SOLN: INTRAVENOUS | Status: DC

## 2016-04-22 NOTE — ED Provider Notes (Signed)
CSN: [id number]     Arrival date & time 04/22/16  2107 History   By signing my name below, I, Marisue Humble, attest that this documentation has been prepared under the direction and in the presence of Linwood Dibbles, MD . Electronically Signed: Marisue Humble, Scribe. 04/22/2016. 10:11 PM.   Chief Complaint  Patient presents with  . Headache    The history is provided by the patient and a relative. No language interpreter was used.   HPI Comments:  Natalie Willis is a 70 y.o. female with PMHx of HTN, HLD and aneurysm of opthalmic artery who presents to the Emergency Department complaining of tingling headache feeling like an electric shock and left-sided tingling and burning onset last night. Headache is exacerbated by light. Relative states pt also c/o tongue numbness, ear pain, and feeling shocks when she touches herself. Pt reports associated nausea and dry mouth. No alleviating factors noted or treatments attempted PTA. Denies difficulty balancing or fever.  Past Medical History  Diagnosis Date  . Hypertension   . Hematuria   . Right ureteral stone   . 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 Internal hemorrhoid   . Hyperlipidemia   . GERD (gastroesophageal reflux disease)   . HA (headache)    Past Surgical History  Procedure Laterality Date  . Appendectomy    . Abdominal hysterectomy    . Colonoscopy  02-15-2012  . Cardiac catheterization  10/ 2009   DR  [facility]    NORMAL CORONARIES AND  LVSF  . Transthoracic echocardiogram  10-19-2008    NORMAL STUDY/  EF 55%  . Cystoscopy with retrograde pyelogram, ureteroscopy and stent placement Right 04/14/2014    Procedure: CYSTOSCOPY WITH RETROGRADE PYELOGRAM, URETEROSCOPY AND STENT PLACEMENT;  Surgeon: Magdalene Molly, MD;  Location: [facility];  Service: Urology;  Laterality: Right;   Family History  Problem Relation Age of Onset  . Heart attack Father     Social History  Substance Use Topics  . Smoking status: Never Smoker   . Smokeless tobacco: Never Used  . Alcohol Use: No   OB History    No data available     Review of Systems  Constitutional: Negative for fever.  HENT: Positive for ear pain.   Eyes: Positive for photophobia.  Gastrointestinal: Positive for nausea.  Musculoskeletal: Negative for gait problem.  Neurological: Positive for numbness and headaches. Negative for dizziness and light-headedness.  All other systems reviewed and are negative.   Allergies  Dilaudid and Hydromorphone  Home Medications   Prior to Admission medications   Medication Sig Start Date End Date Taking? Authorizing Provider  albuterol (PROVENTIL HFA;VENTOLIN HFA) 108 (90 BASE) MCG/ACT inhaler Inhale 2 puffs into the lungs every 6 (six) hours as needed for wheezing.    Historical Provider, MD  amLODipine (NORVASC) 5 MG tablet Take 5 mg by mouth daily.    Historical Provider, MD  hydrocortisone (PROCTOZONE-HC) 2.5 % rectal cream Place 1 application rectally 2 (two) times daily. 02/09/14   Romie Levee, MD  lisinopril (PRINIVIL,ZESTRIL) 40 MG tablet Take 40 mg by mouth daily.    Historical Provider, MD  Multiple Vitamin (MULTIVITAMIN WITH MINERALS) TABS tablet Take 1 tablet by mouth daily.    Historical Provider, MD  omeprazole (PRILOSEC) 20 MG capsule Take 20 mg by mouth daily.    Historical Provider, MD  phenazopyridine (PYRIDIUM) 200  MG tablet Take 1 tablet (200 mg total) by mouth 3 (three) times daily as needed for pain. 04/14/14   Natalia Leatherwoodaniel Woodruff, MD  simvastatin (ZOCOR) 20 MG tablet Take 20 mg by mouth every evening.    Historical Provider, MD   BP 134/75 mmHg  Pulse 83  Temp(Src) 98.9 F (37.2 C) (Oral)  Resp 18  Ht 5\' 1"  (1.549 m)  Wt 55.792 kg  BMI 23.25 kg/m2  SpO2 95%   Physical Exam  Constitutional: She is oriented to person, place, and time. She appears well-developed and well-nourished. No distress.  HENT:  Head:  Normocephalic and atraumatic.  Right Ear: Tympanic membrane and external ear normal.  Left Ear: Tympanic membrane and external ear normal.  Mouth/Throat: Oropharynx is clear and moist. No oral lesions. No oropharyngeal exudate, posterior oropharyngeal edema, posterior oropharyngeal erythema or tonsillar abscesses.  Eyes: Conjunctivae are normal. Right eye exhibits no discharge. Left eye exhibits no discharge. No scleral icterus.  Neck: Neck supple. No tracheal deviation present.  Cardiovascular: Normal rate, regular rhythm and intact distal pulses.   Pulmonary/Chest: Effort normal and breath sounds normal. No stridor. No respiratory distress. She has no wheezes. She has no rales.  Abdominal: Soft. Bowel sounds are normal. She exhibits no distension. There is no tenderness. There is no rebound and no guarding.  Musculoskeletal: She exhibits no edema or tenderness.  Neurological: She is alert and oriented to person, place, and time. She has normal strength. No cranial nerve deficit (No facial droop, extraocular movements intact, tongue midline ) or sensory deficit. She exhibits normal muscle tone. She displays no seizure activity. Coordination normal.  No pronator drift bilateral upper extrem, able to hold both legs off bed for 5 seconds, sensation intact in all extremities, no visual field cuts, no left or right sided neglect, normal finger-nose exam bilaterally, no nystagmus noted  Skin: Skin is warm and dry. No rash noted.  Psychiatric: She has a normal mood and affect.  Nursing note and vitals reviewed.   ED Course  Procedures  DIAGNOSTIC STUDIES:  Oxygen Saturation is 100% on RA, normal by my interpretation.    COORDINATION OF CARE:  10:09 PM Will order imaging and lab work. Discussed treatment plan with pt at bedside and pt agreed to plan.  Labs Review Labs Reviewed  BASIC METABOLIC PANEL - Abnormal; Notable for the following:    Glucose, Bld 139 (*)    All other components within  normal limits  CBC WITH DIFFERENTIAL/PLATELET   Medications  0.9 %  sodium chloride infusion (not administered)  prochlorperazine (COMPAZINE) injection 10 mg (8 mg Intravenous Given 04/22/16 2223)  diphenhydrAMINE (BENADRYL) injection 12.5 mg (12.5 mg Intravenous Given 04/22/16 2221)  sodium chloride 0.9 % bolus 1,000 mL (1,000 mLs Intravenous New Bag/Given 04/22/16 2219)   Ct Head Wo Contrast  04/23/2016  CLINICAL DATA:  Acute onset of tingling headache and photosensitivity. Tongue numbness. Initial encounter. EXAM: CT HEAD WITHOUT CONTRAST TECHNIQUE: Contiguous axial images were obtained from the base of the skull through the vertex without intravenous contrast. COMPARISON:  CT of the head performed 05/01/2015, and MRA of the brain performed 09/22/2014 FINDINGS: There is no evidence of acute infarction, mass lesion, or intra- or extra-axial hemorrhage on CT. The posterior fossa, including the cerebellum, brainstem and fourth ventricle, is within normal limits. The third and lateral ventricles, and basal ganglia are unremarkable in appearance. The cerebral hemispheres are symmetric in appearance, with normal gray-white differentiation. No mass effect or midline shift is seen.  There is no evidence of fracture; visualized osseous structures are unremarkable in appearance. The visualized portions of the orbits are within normal limits. The paranasal sinuses and mastoid air cells are well-aerated. No significant soft tissue abnormalities are seen. IMPRESSION: Unremarkable noncontrast CT of the head. Electronically Signed   By: Roanna Raider M.D.   On: 04/23/2016 00:17      MDM   Final diagnoses:  Other migraine without status migrainosus, not intractable   Pt was treated with a migraine cocktail.  Her symptoms improved.  Labs and CT scan are reassuring.  Doubt stroke, TIA, SAH or other emergent neurological condition.  I personally performed the services described in this documentation, which was  scribed in my presence.  The recorded information has been reviewed and is accurate.    Linwood Dibbles, MD 04/23/16 [place]ers

## 2016-04-22 NOTE — ED Notes (Signed)
Alert, NAD, calm, interactive, "feeling better", [facility], IVF infusing, up to b/r, steady gait with family.

## 2016-04-22 NOTE — ED Notes (Addendum)
Pt alert, NAD, calm, interactive, speech clear, PERRL, c/o L sided head and face, electric tingling, shooting, (denies: HA, light sensitivity, nvd, fever, below neck sx, dizziness or visual changes), [facility] Rates discomfort 8/10. Unable to update meds, "list and meds at home".

## 2016-04-22 NOTE — ED Notes (Signed)
Patient is having a pain up into head that she feels like it is an [employer] tingle. Reports that her tongue is going numb. This has been going on since last night

## 2016-04-22 NOTE — ED Notes (Signed)
Remains mildly anxious, improving, will continue to monitor, sleepy, alert, NAD, calm, interactive, VSS, to CT.

## 2016-04-23 DIAGNOSIS — G43809 Other migraine, not intractable, without status migrainosus: Secondary | ICD-10-CM | POA: Diagnosis not present

## 2016-04-23 NOTE — ED Notes (Signed)
Dr. Lynelle DoctorKnapp in to see/ update.

## 2016-04-23 NOTE — Discharge Instructions (Signed)
Cefalea migrañosa  (Migraine Headache)  Una cefalea migrañosa es un dolor intenso y punzante en uno o ambos lados de la cabeza. La migraña puede durar desde 30 minutos hasta varias horas.  CAUSAS   No siempre se conoce la causa exacta de la cefalea migrañosa. Sin embargo, pueden aparecer cuando los nervios del cerebro se irritan y liberan ciertas sustancias químicas que causan inflamación. Esto ocasiona dolor.  Existen también ciertos factores que pueden desencadenar las migrañas, como los siguientes:  · Alcohol.  · Fumar.  · Estrés.  · La menstruación  · Quesos añejados.  · Los alimentos o las bebidas que contienen nitratos, glutamato, aspartamo o tiramina.  · Falta de sueño.  · Chocolate.  · Cafeína.  · Hambre.  · Actividad física extenuante.  · Fatiga.  · Medicamentos que se usan para tratar el dolor en el pecho (nitroglicerina), píldoras anticonceptivas, estrógeno y algunos medicamentos para la hipertensión arterial.  SIGNOS Y SÍNTOMAS  · Dolor en uno o ambos lados de la cabeza.  · Dolor pulsante o punzante.  · Dolor intenso que impide realizar las actividades diarias.  · Dolor que se agrava por cualquier actividad física.  · Náuseas, vómitos o ambos.  · Mareos.  · Dolor con la exposición a las luces brillantes, a los ruidos fuertes o la actividad.  · Sensibilidad general a las luces brillantes, a los ruidos fuertes o a los olores.  Antes de sufrir una migraña, puede recibir señales de advertencia (aura). Un aura puede incluir:  · Ver las luces intermitentes.  · Ver puntos brillantes, halos o líneas en zigzag.  · Tener una visión en túnel o visión borrosa.  · Sensación de entumecimiento u hormigueo.  · Dificultad para hablar  · Debilidad muscular.  DIAGNÓSTICO   La cefalea migrañosa se diagnostica en función de lo siguiente:  · Síntomas.  · Examen físico.  · Una TC (tomografía computada) o resonancia magnética de la cabeza. Estas pruebas de diagnóstico por imagen no pueden diagnosticar las migrañas, pero pueden  ayudar a descartar otras causas de las cefaleas.  TRATAMIENTO  Le prescribirán medicamentos para aliviar el dolor y las náuseas. También podrán administrarse medicamentos para ayudar a prevenir las migrañas recurrentes.   INSTRUCCIONES PARA EL CUIDADO EN EL HOGAR  · Sólo tome medicamentos de venta libre o recetados para calmar el dolor o el malestar, según las indicaciones de su médico. No se recomienda usar los opiáceos a largo plazo.  · Cuando tenga la migraña, acuéstese en un cuarto oscuro y tranquilo  · Lleve un registro diario para averiguar lo que puede provocar las cefaleas migrañosas. Por ejemplo, escriba:    Lo que usted come y bebe.    Cuánto tiempo duerme.    Algún cambio en su dieta o en los medicamentos.  · Limite el consumo de bebidas alcohólicas.  · Si fuma, deje de hacerlo.  · Duerma entre 7 y 9 horas, o según las recomendaciones del médico.  · Limite el estrés.  · Mantenga las luces tenues si le molestan las luces brillantes y la migraña empeora.  SOLICITE ATENCIÓN MÉDICA DE INMEDIATO SI:   · La migraña se hace cada vez más intensa.  · Tiene fiebre.  · Presenta rigidez en el cuello.  · Tiene pérdida de visión.  · Presenta debilidad muscular o pérdida del control muscular.  · Comienza a perder el equilibrio o tiene problemas para caminar.  · Sufre mareos o se desmaya.  · Tiene síntomas graves que son diferentes   a los primeros síntomas.  ASEGÚRESE DE QUE:   · Comprende estas instrucciones.  · Controlará su afección.  · Recibirá ayuda de inmediato si no mejora o si empeora.     Esta información no tiene como fin reemplazar el consejo del médico. Asegúrese de hacerle al médico cualquier pregunta que tenga.     Document Released: 10/22/2005 Document Revised: 08/12/2013  Elsevier Interactive Patient Education ©2016 Elsevier Inc.

## 2016-10-04 ENCOUNTER — Ambulatory Visit
Admission: RE | Admit: 2016-10-04 | Discharge: 2016-10-04 | Disposition: A | Discharge status: Home / Self Care | Payer: Medicare HMO | Source: Ambulatory Visit | Attending: Cardiovascular Disease | Admitting: Cardiovascular Disease

## 2016-10-04 ENCOUNTER — Other Ambulatory Visit: Payer: Self-pay | Admitting: Cardiovascular Disease

## 2016-10-04 DIAGNOSIS — R52 Pain, unspecified: Secondary | ICD-10-CM

## 2016-11-16 ENCOUNTER — Other Ambulatory Visit: Payer: Self-pay | Admitting: Cardiovascular Disease

## 2016-11-16 ENCOUNTER — Ambulatory Visit
Admission: RE | Admit: 2016-11-16 | Discharge: 2016-11-16 | Disposition: A | Discharge status: Home / Self Care | Payer: Medicare HMO | Source: Ambulatory Visit | Attending: Cardiovascular Disease | Admitting: Cardiovascular Disease

## 2016-11-16 DIAGNOSIS — R05 Cough: Secondary | ICD-10-CM

## 2016-11-16 DIAGNOSIS — R059 Cough, unspecified: Secondary | ICD-10-CM

## 2016-11-20 ENCOUNTER — Institutional Professional Consult (permissible substitution): Payer: Medicare HMO | Admitting: Neurology

## 2017-07-18 ENCOUNTER — Observation Stay (HOSPITAL_COMMUNITY)
Admission: AD | Admit: 2017-07-18 | Discharge: 2017-07-19 | Disposition: A | Discharge status: Home / Self Care | Payer: Medicare HMO | Source: Ambulatory Visit | Attending: Cardiovascular Disease | Admitting: Cardiovascular Disease

## 2017-07-18 ENCOUNTER — Observation Stay (HOSPITAL_COMMUNITY): Payer: Medicare HMO

## 2017-07-18 ENCOUNTER — Encounter (HOSPITAL_COMMUNITY): Payer: Self-pay

## 2017-07-18 DIAGNOSIS — R110 Nausea: Secondary | ICD-10-CM | POA: Insufficient documentation

## 2017-07-18 DIAGNOSIS — R51 Headache: Secondary | ICD-10-CM | POA: Insufficient documentation

## 2017-07-18 DIAGNOSIS — R0602 Shortness of breath: Secondary | ICD-10-CM | POA: Diagnosis not present

## 2017-07-18 DIAGNOSIS — Z960 Presence of urogenital implants: Secondary | ICD-10-CM | POA: Diagnosis not present

## 2017-07-18 DIAGNOSIS — R072 Precordial pain: Secondary | ICD-10-CM | POA: Diagnosis not present

## 2017-07-18 DIAGNOSIS — Z79899 Other long term (current) drug therapy: Secondary | ICD-10-CM | POA: Insufficient documentation

## 2017-07-18 DIAGNOSIS — Z8679 Personal history of other diseases of the circulatory system: Secondary | ICD-10-CM | POA: Insufficient documentation

## 2017-07-18 DIAGNOSIS — Z8719 Personal history of other diseases of the digestive system: Secondary | ICD-10-CM | POA: Diagnosis not present

## 2017-07-18 DIAGNOSIS — I249 Acute ischemic heart disease, unspecified: Secondary | ICD-10-CM | POA: Diagnosis present

## 2017-07-18 DIAGNOSIS — I10 Essential (primary) hypertension: Secondary | ICD-10-CM | POA: Diagnosis not present

## 2017-07-18 DIAGNOSIS — J45909 Unspecified asthma, uncomplicated: Secondary | ICD-10-CM | POA: Diagnosis not present

## 2017-07-18 LAB — COMPREHENSIVE METABOLIC PANEL
ALK PHOS: 57 U/L (ref 38–126)
ALT: 15 U/L (ref 14–54)
AST: 19 U/L (ref 15–41)
Albumin: 4.4 g/dL (ref 3.5–5.0)
Anion gap: 8 (ref 5–15)
BUN: 16 mg/dL (ref 6–20)
CALCIUM: 9.4 mg/dL (ref 8.9–10.3)
CO2: 25 mmol/L (ref 22–32)
CREATININE: 0.81 mg/dL (ref 0.44–1.00)
Chloride: 107 mmol/L (ref 101–111)
GFR calc Af Amer: >60 mL/min (ref >60)
GLUCOSE: 94 mg/dL (ref 65–99)
POTASSIUM: 4.1 mmol/L (ref 3.5–5.1)
Sodium: 140 mmol/L (ref 135–145)
TOTAL PROTEIN: 7.3 g/dL (ref 6.5–8.1)
Total Bilirubin: 0.4 mg/dL (ref 0.3–1.2)

## 2017-07-18 LAB — CBC WITH DIFFERENTIAL/PLATELET
BASOS ABS: 0.1 10*3/uL (ref 0.0–0.1)
BASOS PCT: 1 %
EOS PCT: 1 %
Eosinophils Absolute: 0.1 10*3/uL (ref 0.0–0.7)
HEMATOCRIT: 37.5 % (ref 36.0–46.0)
Hemoglobin: 12 g/dL (ref 12.0–15.0)
Lymphocytes Relative: 29 %
Lymphs Abs: 2 10*3/uL (ref 0.7–4.0)
MCH: 29.5 pg (ref 26.0–34.0)
MCHC: 32 g/dL (ref 30.0–36.0)
MCV: 92.1 fL (ref 78.0–100.0)
MONOS PCT: 5 %
Monocytes Absolute: 0.4 10*3/uL (ref 0.1–1.0)
NEUTROS ABS: 4.4 10*3/uL (ref 1.7–7.7)
Neutrophils Relative %: 64 %
PLATELETS: 319 10*3/uL (ref 150–400)
RBC: 4.07 MIL/uL (ref 3.87–5.11)
RDW: 14.4 % (ref 11.5–15.5)
WBC: 6.9 10*3/uL (ref 4.0–10.5)

## 2017-07-18 LAB — TROPONIN I

## 2017-07-18 LAB — PROTIME-INR
INR: 0.92
Prothrombin Time: 12.3 seconds (ref 11.4–15.2)

## 2017-07-18 MED ORDER — ONDANSETRON HCL 4 MG/2ML IJ SOLN
4.0000 mg | Freq: Four times a day (QID) | INTRAMUSCULAR | Status: DC | PRN
  Administered 2017-07-19: 4 mg via INTRAVENOUS

## 2017-07-18 MED ORDER — HEPARIN (PORCINE) IN NACL 100-0.45 UNIT/ML-% IJ SOLN
650.0000 [IU]/h | INTRAMUSCULAR | Status: DC
  Administered 2017-07-18: 800 [IU]/h via INTRAVENOUS
  Filled 2017-07-18: qty 250

## 2017-07-18 MED ORDER — ATORVASTATIN CALCIUM 40 MG PO TABS
40.0000 mg | ORAL_TABLET | Freq: Every day | ORAL | Status: DC
  Filled 2017-07-18: qty 1

## 2017-07-18 MED ORDER — AMLODIPINE BESYLATE 5 MG PO TABS
5.0000 mg | ORAL_TABLET | Freq: Every day | ORAL | Status: DC
  Administered 2017-07-19: 5 mg via ORAL
  Filled 2017-07-18: qty 1

## 2017-07-18 MED ORDER — PANTOPRAZOLE SODIUM 40 MG PO TBEC
40.0000 mg | DELAYED_RELEASE_TABLET | Freq: Every day | ORAL | Status: DC
  Administered 2017-07-19: 40 mg via ORAL
  Filled 2017-07-18: qty 1

## 2017-07-18 MED ORDER — SODIUM CHLORIDE 0.9 % IV SOLN: 250.0000 mL | INTRAVENOUS | Status: DC | PRN

## 2017-07-18 MED ORDER — SODIUM CHLORIDE 0.9% FLUSH: 3.0000 mL | INTRAVENOUS | Status: DC | PRN

## 2017-07-18 MED ORDER — ZOLPIDEM TARTRATE 5 MG PO TABS
5.0000 mg | ORAL_TABLET | Freq: Every evening | ORAL | Status: DC | PRN
  Administered 2017-07-19: 5 mg via ORAL
  Filled 2017-07-18: qty 1

## 2017-07-18 MED ORDER — LISINOPRIL 40 MG PO TABS
40.0000 mg | ORAL_TABLET | Freq: Every day | ORAL | Status: DC
  Administered 2017-07-19: 40 mg via ORAL
  Filled 2017-07-18: qty 1

## 2017-07-18 MED ORDER — ACETAMINOPHEN 325 MG PO TABS: 650.0000 mg | ORAL_TABLET | ORAL | Status: DC | PRN

## 2017-07-18 MED ORDER — NITROGLYCERIN 0.4 MG SL SUBL: 0.4000 mg | SUBLINGUAL_TABLET | SUBLINGUAL | Status: DC | PRN

## 2017-07-18 MED ORDER — SODIUM CHLORIDE 0.9% FLUSH: 3.0000 mL | Freq: Two times a day (BID) | INTRAVENOUS | Status: DC

## 2017-07-18 MED ORDER — ASPIRIN EC 81 MG PO TBEC: 81.0000 mg | DELAYED_RELEASE_TABLET | Freq: Every day | ORAL | Status: DC

## 2017-07-18 NOTE — Progress Notes (Signed)
ANTICOAGULATION CONSULT NOTE - Initial Consult  Pharmacy Consult for Heparin Indication: chest pain/ACS  Allergies  Allergen Reactions  . Dilaudid [Hydromorphone Hcl] Nausea And Vomiting    Not an allergy, but pt appears sensitive to IV narcotics.   . Hydromorphone Nausea And Vomiting    Not an allergy, but pt appears sensitive to IV narcotics.     Patient Measurements: Height: 5\' 1"  (154.9 cm) Weight: 120 lb 11.2 oz (54.7 kg) IBW/kg (Calculated) : 47.8 Heparin Dosing Weight: 54.7 kg  Vital Signs: Temp: 98.4 F (36.9 C) (09/13 2000) Temp Source: Oral (09/13 2000) BP: 155/77 (09/13 2000) Pulse Rate: 80 (09/13 2000)  Labs: No results for input(s): HGB, HCT, PLT, APTT, LABPROT, INR, HEPARINUNFRC, HEPRLOWMOCWT, CREATININE, CKTOTAL, CKMB, TROPONINI in the last 72 hours.  CrCl cannot be calculated (Patient's most recent lab result is older than the maximum 21 days allowed.).   Medical History: Past Medical History:  Diagnosis Date  . 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 GERD (gastroesophageal reflux disease)   . HA (headache)   . Hematuria   . Hyperlipidemia   . Hypertension   . Internal hemorrhoid   . Right ureteral stone     Assessment: CC/HPI: CP  PMH: GERD, headaches, HTN, opthalmic artery aneurysm 2009, hematuria, HLD, hemorrhoids  Anticoag: CP (PMH with several bleeding risk factors).   Goal of Therapy:  Heparin level 0.3-0.7 units/ml Monitor platelets by anticoagulation protocol: Yes   Plan:  F/u baseline labs Heparin (no bolus) at 800 units/hr Heparin level and CBC in AM   [place] S. Merilynn Finlandobertson, PharmD, BCPS Clinical Staff Pharmacist Pager [phone]  Misty Stanleyobertson, Selestino Nila Stillinger 07/18/2017,9:10 PM

## 2017-07-18 NOTE — Plan of Care (Signed)
Problem: Pain Managment: Goal: General experience of comfort will improve Outcome: Progressing Pt denies anymore CP.

## 2017-07-18 NOTE — H&P (Signed)
Referring Physician:  [name] is an 71 y.o. female.                       Chief Complaint: Chest pain  HPI: 71 year old female with PMH of GERD, headache and hypertension has left precordial chest pain, recurrent, lasting for few seconds to minutes and associated with shortness of breath and nausea. She denies radiation of pain and association with sweating spell or exertion. No fever or cough.  Past Medical History:  Diagnosis Date  . 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 GERD (gastroesophageal reflux disease)   . HA (headache)   . Hematuria   . Hyperlipidemia   . Hypertension   . Internal hemorrhoid   . Right ureteral stone       Past Surgical History:  Procedure Laterality Date  . ABDOMINAL HYSTERECTOMY    . APPENDECTOMY    . CARDIAC [facility]TER[facility]  [facility]   NORMAL CORONARIES AND  LVSF  . COLONOSCOPY  02-15-2012  . CYSTOSCOPY WITH RETROGRADE PYELOGRAM, URETEROSCOPY AND STENT PLACEMENT Right 04/14/2014   Procedure: CYSTOSCOPY WITH RETROGRADE PYELOGRAM, URETEROSCOPY AND STENT PLACEMENT;  Surgeon: Magdalene Mollyaniel Y Woodruff, MD;  [facility];  Service: Urology;  Laterality: Right;  . TRANSTHORACIC ECHOCARDIOGRAM  10-19-2008   NORMAL STUDY/  EF 55%    Family History  Problem Relation Age of Onset  . Heart attack Father    Social History:  reports that she has never smoked. She has never used smokeless tobacco. She reports that she does not drink alcohol or use drugs.  Allergies:  Allergies  Allergen Reactions  . Dilaudid [Hydromorphone Hcl] Nausea And Vomiting    Not an allergy, but pt appears sensitive to IV narcotics.   . Hydromorphone Nausea And Vomiting    Not an allergy, but pt appears sensitive to IV narcotics.     Medications Prior to Admission  Medication Sig Dispense Refill  . albuterol (PROVENTIL HFA;VENTOLIN HFA) 108 (90 BASE) MCG/ACT inhaler Inhale 2 puffs  into the lungs every 6 (six) hours as needed for wheezing.    Marland Kitchen. amLODipine (NORVASC) 5 MG tablet Take 5 mg by mouth daily.    . hydrocortisone (PROCTOZONE-HC) 2.5 % rectal cream Place 1 application rectally 2 (two) times daily. 30 g 1  . lisinopril (PRINIVIL,ZESTRIL) 40 MG tablet Take 40 mg by mouth daily.    . Multiple Vitamin (MULTIVITAMIN WITH MINERALS) TABS tablet Take 1 tablet by mouth daily.    Marland Kitchen. omeprazole (PRILOSEC) 20 MG capsule Take 20 mg by mouth daily.    . [DISCONTINUED] phenazopyridine (PYRIDIUM) 200 MG tablet Take 1 tablet (200 mg total) by mouth 3 (three) times daily as needed for pain. 30 tablet 6  . [DISCONTINUED] simvastatin (ZOCOR) 20 MG tablet Take 20 mg by mouth every evening.      No results found for this or any previous visit (from the past 48 hour(s)). No results found.  Review Of Systems Constitutional: No fever, chills, weight loss or gain. Eyes: No vision change, wears glasses. No discharge or pain. Ears: No hearing loss, No tinnitus. Respiratory: No asthma, COPD, pneumonias. No shortness of breath. No hemoptysis. Cardiovascular: Positive chest pain, no palpitation, leg edema. Gastrointestinal: No nausea, vomiting, diarrhea, constipation. No GI bleed. No hepatitis. Genitourinary: No dysuria, hematuria, positive kidney stone. No incontinance.  Neurological: Positive headache, stroke, seizures.  Psychiatry: No psych facility admission for anxiety, depression, suicide. No detox. Skin: No rash. Musculoskeletal: Positive joint pain, fibromyalgia. No neck pain, back pain. Lymphadenopathy: No lymphadenopathy. Hematology: No anemia or easy bruising.   Blood pressure (!) 155/77, pulse 80, temperature 98.4 F (36.9 C), temperature source Oral, resp. rate 16, height  (1.549 m), weight 54.7 kg (120 lb 11.2 oz), SpO2 98 %. Body mass index is 22.81 kg/m. General appearance: alert, cooperative, appears stated age and no distress Head: Normocephalic,  atraumatic. Eyes: Brown eyes, pink conjunctiva, corneas clear. PERRL, EOM's intact. Neck: No adenopathy, no carotid bruit, no JVD, supple, symmetrical, trachea midline and thyroid not enlarged. Resp: Clear to auscultation bilaterally. Cardio: Regular rate and rhythm, S1, S2 normal, II/VI systolic murmur, no click, rub or gallop GI: Soft, non-tender; bowel sounds normal; no organomegaly. Extremities: No edema, cyanosis or clubbing. Skin: Warm and dry.  Neurologic: Alert and oriented X 3, normal strength. Normal coordination and gait.  Assessment/Plan Precordial Chest pain r/o MI Hypertension GERD Headache H/O brain aneurysm  Place in observation Cardiac cath if Troponin-I is abnormal Otherwise NM Myocardial perfusion scan.   Ricki Rodriguez, MD  07/18/2017, 8:21 PM

## 2017-07-19 ENCOUNTER — Observation Stay (HOSPITAL_COMMUNITY): Payer: Medicare HMO

## 2017-07-19 DIAGNOSIS — I10 Essential (primary) hypertension: Secondary | ICD-10-CM | POA: Diagnosis not present

## 2017-07-19 DIAGNOSIS — R110 Nausea: Secondary | ICD-10-CM | POA: Diagnosis not present

## 2017-07-19 DIAGNOSIS — R0602 Shortness of breath: Secondary | ICD-10-CM | POA: Diagnosis not present

## 2017-07-19 DIAGNOSIS — R072 Precordial pain: Secondary | ICD-10-CM | POA: Diagnosis not present

## 2017-07-19 LAB — TROPONIN I: Troponin I: <0.03 ng/mL (ref <0.03)

## 2017-07-19 LAB — BASIC METABOLIC PANEL
ANION GAP: 5 (ref 5–15)
BUN: 12 mg/dL (ref 6–20)
CALCIUM: 9.5 mg/dL (ref 8.9–10.3)
CO2: 27 mmol/L (ref 22–32)
Chloride: 108 mmol/L (ref 101–111)
Creatinine, Ser: 0.82 mg/dL (ref 0.44–1.00)
GLUCOSE: 91 mg/dL (ref 65–99)
Potassium: 4.3 mmol/L (ref 3.5–5.1)
SODIUM: 140 mmol/L (ref 135–145)

## 2017-07-19 LAB — CBC
HCT: 35.8 % — ABNORMAL LOW (ref 36.0–46.0)
Hemoglobin: 11.4 g/dL — ABNORMAL LOW (ref 12.0–15.0)
MCH: 29.5 pg (ref 26.0–34.0)
MCHC: 31.8 g/dL (ref 30.0–36.0)
MCV: 92.5 fL (ref 78.0–100.0)
PLATELETS: 298 10*3/uL (ref 150–400)
RBC: 3.87 MIL/uL (ref 3.87–5.11)
RDW: 14.3 % (ref 11.5–15.5)
WBC: 6.8 10*3/uL (ref 4.0–10.5)

## 2017-07-19 LAB — HEPARIN LEVEL (UNFRACTIONATED)
HEPARIN UNFRACTIONATED: 1.08 [IU]/mL — AB (ref 0.30–0.70)
Heparin Unfractionated: 0.43 IU/mL (ref 0.30–0.70)

## 2017-07-19 LAB — LIPID PANEL
Cholesterol: 216 mg/dL — ABNORMAL HIGH (ref 0–200)
HDL: 53 mg/dL (ref >40)
LDL CALC: 143 mg/dL — AB (ref 0–99)
TRIGLYCERIDES: 99 mg/dL (ref <150)
Total CHOL/HDL Ratio: 4.1 RATIO
VLDL: 20 mg/dL (ref 0–40)

## 2017-07-19 MED ORDER — ONDANSETRON HCL 4 MG/2ML IJ SOLN
INTRAMUSCULAR | Status: AC
  Filled 2017-07-19: qty 2

## 2017-07-19 MED ORDER — TECHNETIUM TC 99M TETROFOSMIN IV KIT
30.0000 | PACK | Freq: Once | INTRAVENOUS | Status: AC | PRN
  Administered 2017-07-19: 30 via INTRAVENOUS

## 2017-07-19 MED ORDER — TECHNETIUM TC 99M TETROFOSMIN IV KIT
10.0000 | PACK | Freq: Once | INTRAVENOUS | Status: AC | PRN
  Administered 2017-07-19: 10 via INTRAVENOUS

## 2017-07-19 MED ORDER — AMLODIPINE BESYLATE 5 MG PO TABS: 5.0000 mg | ORAL_TABLET | Freq: Every day | ORAL | Status: DC

## 2017-07-19 MED ORDER — REGADENOSON 0.4 MG/5ML IV SOLN
INTRAVENOUS | Status: AC
  Filled 2017-07-19: qty 5

## 2017-07-19 MED ORDER — REGADENOSON 0.4 MG/5ML IV SOLN
0.4000 mg | Freq: Once | INTRAVENOUS | Status: AC
  Administered 2017-07-19: 0.4 mg via INTRAVENOUS
  Filled 2017-07-19: qty 5

## 2017-07-19 NOTE — Progress Notes (Signed)
Patient given discharge instructions with daughter at the bedside. Patient had no questions. Dr. Algie Coffer spoke to family about stress test and to follow up within a week. Patient and family were agreeable. PIV removed. Patient denied any chest pain or discomfort while during my shift. Escorted out via wheelchair by RN.

## 2017-07-19 NOTE — Progress Notes (Signed)
Chaplain introduced herself to patient to fulfill the request for prayer.  Patient speaks Spanish and some [place].  I asked the patient if she could understand me and the patient said she could. The Spanish Interpreter arrived after the patient and I had already prayed and they both said it was fine.  Chaplain listened to patient as she talked about her daughter, the daughters work and church.  Patient says the weather has her stressful but she if feeling better.  Patient and I prayed together.  Chaplain will remain available as needed for this patient.    07/19/17 0939  Clinical Encounter Type  Visited With Patient  Visit Type Initial;Psychological support;Spiritual support;Social support  Referral From Nurse;Physician  Consult/Referral To Chaplain  Spiritual Encounters  Spiritual Needs Prayer

## 2017-07-19 NOTE — Progress Notes (Signed)
ANTICOAGULATION CONSULT NOTE - FOLLOW UP    HL = 0.43 (goal 0.3 - 0.7 units/mL) Heparin dosing weight = 55 kg   Assessment: 71 YOF on IV heparin for chest pain.  Heparin level is therapeutic but drawn early.  No bleeding documented.  CBC stable.   Plan: Continue heparin gtt at 800 units/hr Check confirmatory heparin level   Jerrico Covello D. Laney Potash, PharmD, BCPS  07/19/2017, 3:11 AM

## 2017-07-19 NOTE — Discharge Summary (Signed)
Physician Discharge Summary  Patient ID: Natalie Willis MRN: [id number] DOB/AGE: 06-11-1946 71 y.o.  Admit date: 07/18/2017 Discharge date: 07/19/2017  Admission Diagnoses: Precordial Chest pain Hypertension GERD Headache H/O Brain aneurysm  Discharge Diagnoses:  Principal Problem:   Chest pain, precordial Active Problems:   Hypertension   GERD   Headache   H/O brain aneurysm (Left opthalmic artery)   Asthma  Discharged Condition: fair  Hospital Course: 71 year old female with PMH of GERD, headache and hypertension had left precordial chest pain. Her troponin-Is were negative x 3. Her nuclear stress test did not show significant reversible ischemia with subtle inferior ST depression on EKG during adenosine infusion.  She wants to continue medical therapy for now and see me in 1 week and primary doctor in 1 month. Her aspirin was held due to surgical procedure coming up in 1 week and B-blocker was held for history of asthma.  Consults: cardiology  Significant Diagnostic Studies: labs: Near normal CBC, Troponin-I and BMET. Cholesterol was elevated at 143 mg. of LDL and 216 Mg. of total cholesterol.  EKG-SR, anterior ischemia and inferior ischemia.  Chest X-ray: Normal.  Nuclear stress test : No reversible ischemia, EF 76 %.  Treatments: cardiac meds: lisinopril (generic), amlodipine and atorvastatin.  Discharge Exam: Blood pressure (!) 143/71, pulse 72, temperature 98.9 F (37.2 C), temperature source Oral, resp. rate 16, height  (1.549 m), weight 54.3 kg (119 lb 9.6 oz), SpO2 95 %. General appearance: alert, cooperative and appears stated age. Head: Normocephalic, atraumatic. Eyes: Brown eyes, pink conjunctiva, corneas clear. PERRL, EOM's intact.  Neck: No adenopathy, no carotid bruit, no JVD, supple, symmetrical, trachea midline and thyroid not enlarged. Resp: Clear to auscultation bilaterally. Cardio: Regular rate and rhythm, S1, S2 normal, II/VI systolic murmur,  no click, rub or gallop. GI: Soft, non-tender; bowel sounds normal; no organomegaly. Extremities: No edema, cyanosis or clubbing. Skin: Warm and dry.  Neurologic: Alert and oriented X 3, normal strength and tone. Normal coordination and gait.  Disposition: 01-Home or Self Care   Allergies as of 07/19/2017      Reactions   Dilaudid [hydromorphone Hcl] Nausea And Vomiting, Other (See Comments)   Not an allergy, but pt appears sensitive to IV narcotics.    Hydromorphone Nausea And Vomiting, Other (See Comments)   Not an allergy, but pt appears sensitive to IV narcotics.       Medication List    STOP taking these medications   hydrocortisone 2.5 % rectal cream Commonly known as:  PROCTOZONE-HC     TAKE these medications   albuterol 108 (90 Base) MCG/ACT inhaler Commonly known as:  PROVENTIL HFA;VENTOLIN HFA Inhale 2 puffs into the lungs every 6 (six) hours as needed for wheezing.   amLODipine 5 MG tablet Commonly known as:  NORVASC Take 5 mg by mouth daily.   atorvastatin 40 MG tablet Commonly known as:  LIPITOR Take 40 mg by mouth daily.   budesonide-formoterol 160-4.5 MCG/ACT inhaler Commonly known as:  SYMBICORT Inhale 2 puffs into the lungs 2 (two) times daily.   cetirizine 10 MG tablet Commonly known as:  ZYRTEC Take 10 mg by mouth daily.   famotidine 10 MG tablet Commonly known as:  PEPCID Take 10 mg by mouth daily.   fluticasone 50 MCG/ACT nasal spray Commonly known as:  FLONASE Place 2 sprays into both nostrils at bedtime.   lisinopril 40 MG tablet Commonly known as:  PRINIVIL,ZESTRIL Take 40 mg by mouth daily.   zolpidem  10 MG tablet Commonly known as:  AMBIEN Take 10 mg by mouth at bedtime.      Follow-up Information    Velazquez, Vira Browns., MD. Schedule an appointment as soon as possible for a visit in 1 month(s).   Specialty:  Internal Medicine Contact information: [address] [place] [address] [phone]        Orpah Cobb,  MD. Schedule an appointment as soon as possible for a visit in 1 week(s).   Specialty:  Cardiology Contact information: [address] [place] [address] [phone]           Signed: Ricki Rodriguez 07/19/2017, 3:35 PM

## 2017-07-19 NOTE — Care Management Obs Status (Signed)
MEDICARE OBSERVATION STATUS NOTIFICATION   Patient Details  Name: CAELIN ROSEN MRN: [id number] Date of Birth: 17-Jun-1946   Medicare Observation Status Notification Given:  Yes    Gala Lewandowsky, RN 07/19/2017, 3:24 PM

## 2017-07-19 NOTE — Progress Notes (Signed)
ANTICOAGULATION CONSULT NOTE - Initial Consult  Pharmacy Consult for Heparin Indication: chest pain/ACS  Allergies  Allergen Reactions  . Dilaudid [Hydromorphone Hcl] Nausea And Vomiting    Not an allergy, but pt appears sensitive to IV narcotics.   . Hydromorphone Nausea And Vomiting    Not an allergy, but pt appears sensitive to IV narcotics.     Patient Measurements: Height:  (154.9 cm) Weight: 119 lb 9.6 oz (54.3 kg) IBW/kg (Calculated) : 47.8 Heparin Dosing Weight: 54.7 kg  Vital Signs: Temp: 98.3 F (36.8 C) (09/14 0427) Temp Source: Oral (09/14 0427) BP: 131/76 (09/14 0427) Pulse Rate: 72 (09/14 0427)  Labs:  Recent Labs  07/18/17 2055 07/19/17 0226 07/19/17 0922  HGB 12.0 11.4*  --   HCT 37.5 35.8*  --   PLT 319 298  --   LABPROT 12.3  --   --   INR 0.92  --   --   HEPARINUNFRC  --  0.43 1.08*  CREATININE 0.81  --   --   TROPONINI <0.03 <0.03  --     Estimated Creatinine Clearance: 49.5 mL/min (by C-G formula based on SCr of 0.81 mg/dL).   Medical History: Past Medical History:  Diagnosis Date  . 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 GERD (gastroesophageal reflux disease)   . HA (headache)   . Hematuria   . Hyperlipidemia   . Hypertension   . Internal hemorrhoid   . Right ureteral stone     Assessment: 71 yo female here with CP. Pharmacy consulted to dose heparin. Plans for perfusion scan vs cath.  -heparin level is 1.08 on 800 units/hr  Goal of Therapy:  Heparin level 0.3-0.7 units/ml Monitor platelets by anticoagulation protocol: Yes   Plan:  -Decrease heparin to 650 units/hr -Heparin level in 6 hours and daily wth CBC daily  Harland German, Pharm D 07/19/2017 10:04 AM

## 2017-08-10 ENCOUNTER — Emergency Department (HOSPITAL_BASED_OUTPATIENT_CLINIC_OR_DEPARTMENT_OTHER): Payer: Medicare HMO

## 2017-08-10 ENCOUNTER — Emergency Department (HOSPITAL_BASED_OUTPATIENT_CLINIC_OR_DEPARTMENT_OTHER)
Admission: EM | Admit: 2017-08-10 | Discharge: 2017-08-10 | Disposition: A | Discharge status: Home / Self Care | Payer: Medicare HMO | Attending: Emergency Medicine | Admitting: Emergency Medicine

## 2017-08-10 ENCOUNTER — Encounter (HOSPITAL_BASED_OUTPATIENT_CLINIC_OR_DEPARTMENT_OTHER): Payer: Self-pay | Admitting: Emergency Medicine

## 2017-08-10 DIAGNOSIS — Z79899 Other long term (current) drug therapy: Secondary | ICD-10-CM | POA: Insufficient documentation

## 2017-08-10 DIAGNOSIS — R51 Headache: Secondary | ICD-10-CM | POA: Insufficient documentation

## 2017-08-10 DIAGNOSIS — I10 Essential (primary) hypertension: Secondary | ICD-10-CM | POA: Diagnosis not present

## 2017-08-10 DIAGNOSIS — R519 Headache, unspecified: Secondary | ICD-10-CM

## 2017-08-10 MED ORDER — TRAMADOL HCL 50 MG PO TABS
50.0000 mg | ORAL_TABLET | Freq: Once | ORAL | Status: AC
  Administered 2017-08-10: 50 mg via ORAL
  Filled 2017-08-10: qty 1

## 2017-08-10 NOTE — ED Triage Notes (Addendum)
PT presents with headache for 2 days. Pt has known aneurysm.

## 2017-08-10 NOTE — Discharge Instructions (Signed)
It was our pleasure to provide your ER care today - we hope that you feel better.  Take acetaminophen and/or ibuprofen as need.  Return to ER if worse, new symptoms, persistent vomiting, acute or severe head pain, other concern.

## 2017-08-10 NOTE — ED Provider Notes (Signed)
[facility] Provider Note   CSN: [id number] Arrival date & time: 08/10/17  2019     History   Chief Complaint Chief Complaint  Patient presents with  . Headache    HPI TERESIA MYINT is a 71 y.o. female.  Patient c/o headache, right sided, onset yesterday. Dull, moderate, persistent, slowly worse today. No abrupt/thunderclap type pain. Hx aneurysm.   Denies neck pain or stiffness. No eye pain or change in vision. Nausea, no vomiting. Denies change in speech. No extremity numbness/weakness. No change in normal functional ability. No recent head trauma. No syncope. No sinus or uri c/o. No fevers.  Tried ibuprofen without relief.    The history is provided by the patient and a relative. A language interpreter was used.  Headache   Associated symptoms include nausea. Pertinent negatives include no fever and no shortness of breath.    Past Medical History:  Diagnosis Date  . 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 GERD (gastroesophageal reflux disease)   . HA (headache)   . Hematuria   . Hyperlipidemia   . Hypertension   . Internal hemorrhoid   . Right ureteral stone     Patient Active Problem List   Diagnosis Date Noted  . Chest pain, precordial 07/18/2017  . Acute coronary syndrome (HCC) 07/18/2017  . Brain aneurysm 09/08/2014  . HA (headache)   . Anal pain 02/09/2014  . Chronic abdominal pain 01/23/2014    Past Surgical History:  Procedure Laterality Date  . ABDOMINAL HYSTERECTOMY    . APPENDECTOMY    . CARDIAC CATHETERIZATION  10/ 2009   DR  [facility]   NORMAL CORONARIES AND  LVSF  . COLONOSCOPY  02-15-2012  . CYSTOSCOPY WITH RETROGRADE PYELOGRAM, URETEROSCOPY AND STENT PLACEMENT Right 04/14/2014   Procedure: CYSTOSCOPY WITH RETROGRADE PYELOGRAM, URETEROSCOPY AND STENT PLACEMENT;  Surgeon: Magdalene Molly, MD;  Location: [facility];  Service: Urology;  Laterality: Right;  .  TRANSTHORACIC ECHOCARDIOGRAM  10-19-2008   NORMAL STUDY/  EF 55%    OB History    No data available       Home Medications    Prior to Admission medications   Medication Sig Start Date End Date Taking? Authorizing Provider  albuterol (PROVENTIL HFA;VENTOLIN HFA) 108 (90 BASE) MCG/ACT inhaler Inhale 2 puffs into the lungs every 6 (six) hours as needed for wheezing.    [provider]  amLODipine (NORVASC) 5 MG tablet Take 5 mg by mouth daily.    [provider]  atorvastatin (LIPITOR) 40 MG tablet Take 40 mg by mouth daily.    [provider]  budesonide-formoterol (SYMBICORT) 160-4.5 MCG/ACT inhaler Inhale 2 puffs into the lungs 2 (two) times daily.    [provider]  cetirizine (ZYRTEC) 10 MG tablet Take 10 mg by mouth daily.    [provider]  famotidine (PEPCID) 10 MG tablet Take 10 mg by mouth daily.    [provider]  fluticasone (FLONASE) 50 MCG/ACT nasal spray Place 2 sprays into both nostrils at bedtime.    [provider]  lisinopril (PRINIVIL,ZESTRIL) 40 MG tablet Take 40 mg by mouth daily.    [provider]  zolpidem (AMBIEN) 10 MG tablet Take 10 mg by mouth at bedtime.    [provider]    Family History Family History  Problem Relation Age of Onset  . Heart  attack Father     Social History Social History  Substance Use Topics  . Smoking status: Never Smoker  . Smokeless tobacco: Never Used  . Alcohol use No     Allergies   Dilaudid [hydromorphone hcl] and Hydromorphone   Review of Systems Review of Systems  Constitutional: Negative for fever.  HENT: Negative for rhinorrhea.   Eyes: Negative for pain, redness and visual disturbance.  Respiratory: Negative for shortness of breath.   Cardiovascular: Negative for chest pain.  Gastrointestinal: Positive for nausea. Negative for abdominal pain.  Genitourinary: Negative for flank pain.  Musculoskeletal: Negative for neck  pain and neck stiffness.  Skin: Negative for rash.  Neurological: Positive for headaches. Negative for weakness and numbness.  Hematological: Does not bruise/bleed easily.  Psychiatric/Behavioral: Negative for confusion.     Physical Exam Updated Vital Signs BP (!) 144/73   Pulse 78   Temp 98.3 F (36.8 C) (Oral)   Resp 20   Ht 1.549 m ( )   Wt 54.4 kg (120 lb)   SpO2 99%   BMI 22.67 kg/m   Physical Exam  Constitutional: She appears well-developed and well-nourished. No distress.  HENT:  Head: Atraumatic.  Right Ear: External ear normal.  Left Ear: External ear normal.  Nose: Nose normal.  Mouth/Throat: Oropharynx is clear and moist.  No sinus or temporal tenderness.  Eyes: Pupils are equal, round, and reactive to light. Conjunctivae and EOM are normal. No scleral icterus.  Neck: Neck supple. No tracheal deviation present. No thyromegaly present.  No stiffness or rigidity.   Cardiovascular: Normal rate, regular rhythm, normal heart sounds and intact distal pulses.  Exam reveals no gallop and no friction rub.   No murmur heard. Pulmonary/Chest: Effort normal and breath sounds normal. No respiratory distress.  Abdominal: Soft. Normal appearance. She exhibits no distension. There is no tenderness.  Genitourinary:  Genitourinary Comments: No cva tenderness.  Musculoskeletal: She exhibits no edema or tenderness.  Neurological: She is alert. No cranial nerve deficit.  Speech clear/fluent. Motor intact bilaterally. No pronator drift. Steady gait.   Skin: Skin is warm and dry. No rash noted. She is not diaphoretic.  Psychiatric: She has a normal mood and affect.  Nursing note and vitals reviewed.    ED Treatments / Results  Labs (all labs ordered are listed, but only abnormal results are displayed) Labs Reviewed - No data to display  EKG  EKG Interpretation None       Radiology Ct Head Wo Contrast  Result Date: 08/10/2017 CLINICAL DATA:  Headaches for 1 day.  Visual disturbances. Pain posterior right side of head. Previous history of aneurysm 10 years ago. Hypertension. EXAM: CT HEAD WITHOUT CONTRAST TECHNIQUE: Contiguous axial images were obtained from the base of the skull through the vertex without intravenous contrast. COMPARISON:  MRA head 11/24/2016.  CT head 04/22/2016 FINDINGS: Brain: Mild cerebral atrophy. No ventricular dilatation. No mass effect or midline shift. No abnormal extra-axial fluid collections. Gray-white matter junctions are distinct. Basal cisterns are not effaced. No acute intracranial hemorrhage. Vascular: Tortuous and ectatic basilar artery. Calcifications in the internal carotid arteries. Skull: Calvarium appears intact.  No depressed skull fractures. Sinuses/Orbits: Paranasal sinuses and mastoid air cells are clear. Other: None. IMPRESSION: No acute intracranial abnormalities.  Mild cerebral atrophy. Electronically Signed   By: Burman Nieves M.D.   On: 08/10/2017 21:38    Procedures Procedures (including critical care time)  Medications Ordered in ED Medications  traMADol (ULTRAM) tablet 50 mg (not administered)  Initial Impression / Assessment and Plan / ED Course  I have reviewed the triage vital signs and the nursing notes.  Pertinent labs & imaging results that were available during my care of the patient were reviewed by me and considered in my medical decision making (see chart for details).  Ultram po.  Given hx aneurysm, will get ct.   Reviewed nursing notes and prior charts for additional history.   Pt comfortable appearing, ct neg acute, normal neuro exam - pt appears stable for d/c.   Final Clinical Impressions(s) / ED Diagnoses   Final diagnoses:  None    New Prescriptions New Prescriptions   No medications on file     Cathren Laine, MD 08/10/17 2143

## 2017-08-10 NOTE — ED Notes (Signed)
Pt and family given d/c instructions as per chart. Verbalize understanding. No questions. 

## 2018-03-19 ENCOUNTER — Other Ambulatory Visit: Payer: Self-pay

## 2018-03-19 ENCOUNTER — Encounter (HOSPITAL_BASED_OUTPATIENT_CLINIC_OR_DEPARTMENT_OTHER): Payer: Self-pay

## 2018-03-19 ENCOUNTER — Emergency Department (HOSPITAL_BASED_OUTPATIENT_CLINIC_OR_DEPARTMENT_OTHER)
Admission: EM | Admit: 2018-03-19 | Discharge: 2018-03-20 | Disposition: A | Discharge status: Home / Self Care | Payer: Medicare HMO | Attending: Emergency Medicine | Admitting: Emergency Medicine

## 2018-03-19 ENCOUNTER — Emergency Department (HOSPITAL_BASED_OUTPATIENT_CLINIC_OR_DEPARTMENT_OTHER): Payer: Medicare HMO

## 2018-03-19 DIAGNOSIS — Z79899 Other long term (current) drug therapy: Secondary | ICD-10-CM | POA: Insufficient documentation

## 2018-03-19 DIAGNOSIS — I10 Essential (primary) hypertension: Secondary | ICD-10-CM | POA: Diagnosis not present

## 2018-03-19 DIAGNOSIS — R519 Headache, unspecified: Secondary | ICD-10-CM

## 2018-03-19 DIAGNOSIS — R51 Headache: Secondary | ICD-10-CM | POA: Insufficient documentation

## 2018-03-19 MED ORDER — IBUPROFEN 800 MG PO TABS
800.0000 mg | ORAL_TABLET | Freq: Once | ORAL | Status: AC
  Administered 2018-03-20: 800 mg via ORAL
  Filled 2018-03-19: qty 1

## 2018-03-19 MED ORDER — TRAMADOL HCL 50 MG PO TABS: 50.0000 mg | ORAL_TABLET | Freq: Once | ORAL | Status: DC

## 2018-03-19 NOTE — ED Triage Notes (Signed)
After reviewing chart, pt's aneurysm was in 2010 and she is not on any blood thinners.

## 2018-03-19 NOTE — ED Triage Notes (Signed)
Headache on right side radiating behind ear and down neck since last night with a nose bleed that started prior to arrival and has since stopped.  Pt and daughter state that pt has an aneurysm and sees neurology.

## 2018-03-19 NOTE — ED Notes (Signed)
Updated pt and family to plan of care and pending CT scan

## 2018-03-20 DIAGNOSIS — R51 Headache: Secondary | ICD-10-CM | POA: Diagnosis not present

## 2018-03-20 NOTE — ED Provider Notes (Signed)
[facility] EMERGENCY DEPARTMENT Provider Note   CSN: [id number] Arrival date & time: 03/19/18  2155     History   Chief Complaint Chief Complaint  Patient presents with  . Headache    HPI Natalie Willis is a 72 y.o. female.  Patient is a 72 year old female with known history of cerebral aneurysm.  She presents today for evaluation of headache.  This began earlier this morning in the absence of any injury or trauma.  This evening she developed a slight nosebleed which was controlled with direct pressure.  This concerned the family and they bring her for evaluation.  She denies any blurry vision, numbness, weakness, or tingling.  She denies any fevers or chills.  The history is provided by the patient.  Headache   This is a recurrent problem. The current episode started yesterday. The problem occurs constantly. The problem has been gradually worsening. The headache is associated with nothing. The pain is located in the right unilateral region. The pain is moderate. The pain does not radiate. Pertinent negatives include no fever, no malaise/fatigue, no nausea and no vomiting. She has tried nothing for the symptoms.    Past Medical History:  Diagnosis Date  . 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 GERD (gastroesophageal reflux disease)   . HA (headache)   . Hematuria   . Hyperlipidemia   . Hypertension   . Internal hemorrhoid   . Right ureteral stone     Patient Active Problem List   Diagnosis Date Noted  . Chest pain, precordial 07/18/2017  . Acute coronary syndrome (HCC) 07/18/2017  . Brain aneurysm 09/08/2014  . HA (headache)   . Anal pain 02/09/2014  . Chronic abdominal pain 01/23/2014    Past Surgical History:  Procedure Laterality Date  . ABDOMINAL HYSTERECTOMY    . APPENDECTOMY    . CARDIAC CATHETERIZATION  10/ 2009   DR  [facility]   NORMAL CORONARIES AND  LVSF  . COLONOSCOPY  02-15-2012  .  CYSTOSCOPY WITH RETROGRADE PYELOGRAM, URETEROSCOPY AND STENT PLACEMENT Right 04/14/2014   Procedure: CYSTOSCOPY WITH RETROGRADE PYELOGRAM, URETEROSCOPY AND STENT PLACEMENT;  Surgeon: Magdalene Molly, MD;  Location: [facility];  Service: Urology;  Laterality: Right;  . TRANSTHORACIC ECHOCARDIOGRAM  10-19-2008   NORMAL STUDY/  EF 55%     OB History   None      Home Medications    Prior to Admission medications   Medication Sig Start Date End Date Taking? Authorizing Provider  albuterol (PROVENTIL HFA;VENTOLIN HFA) 108 (90 BASE) MCG/ACT inhaler Inhale 2 puffs into the lungs every 6 (six) hours as needed for wheezing.    [provider]  amLODipine (NORVASC) 5 MG tablet Take 5 mg by mouth daily.    [provider]  atorvastatin (LIPITOR) 40 MG tablet Take 40 mg by mouth daily.    [provider]  budesonide-formoterol (SYMBICORT) 160-4.5 MCG/ACT inhaler Inhale 2 puffs into the lungs 2 (two) times daily.    [provider]  cetirizine (ZYRTEC) 10 MG tablet Take 10 mg by mouth daily.    [provider]  famotidine (PEPCID) 10 MG tablet Take 10 mg by mouth daily.    [provider]  fluticasone (FLONASE) 50 MCG/ACT nasal spray Place 2 sprays into both nostrils at bedtime.    [provider]  lisinopril (PRINIVIL,ZESTRIL) 40 MG tablet Take  40 mg by mouth daily.    [provider]  zolpidem (AMBIEN) 10 MG tablet Take 10 mg by mouth at bedtime.    [provider]    Family History Family History  Problem Relation Age of Onset  . Heart attack Father     Social History Social History   Tobacco Use  . Smoking status: Never Smoker  . Smokeless tobacco: Never Used  Substance Use Topics  . Alcohol use: No  . Drug use: No     Allergies   Dilaudid [hydromorphone hcl] and Hydromorphone   Review of Systems Review of Systems  Constitutional: Negative for fever and malaise/fatigue.    Gastrointestinal: Negative for nausea and vomiting.  Neurological: Positive for headaches.  All other systems reviewed and are negative.    Physical Exam Updated Vital Signs BP 130/66   Pulse 75   Temp 98.4 F (36.9 C) (Oral)   Resp 16   Ht  (1.549 m)   Wt 55.8 kg (123 lb)   SpO2 98%   BMI 23.24 kg/m   Physical Exam  Constitutional: She is oriented to person, place, and time. She appears well-developed and well-nourished. No distress.  HENT:  Head: Normocephalic and atraumatic.  Nares are clear.  I see no bleeding or site of previous bleeding.  Eyes: Pupils are equal, round, and reactive to light. EOM are normal.  Neck: Normal range of motion. Neck supple. No neck rigidity.  Cardiovascular: Normal rate and regular rhythm. Exam reveals no gallop and no friction rub.  No murmur heard. Pulmonary/Chest: Effort normal and breath sounds normal. No respiratory distress. She has no wheezes.  Abdominal: Soft. Bowel sounds are normal. She exhibits no distension. There is no tenderness.  Musculoskeletal: Normal range of motion.  Neurological: She is alert and oriented to person, place, and time. She has normal strength. Coordination normal.  Skin: Skin is warm and dry. She is not diaphoretic.  Nursing note and vitals reviewed.    ED Treatments / Results  Labs (all labs ordered are listed, but only abnormal results are displayed) Labs Reviewed - No data to display  EKG None  Radiology Ct Head Wo Contrast  Result Date: 03/19/2018 CLINICAL DATA:  72 y/o female with onset of headache, 'pressure' in ears. Pain behind ear and radiates to neck. No prior injury. EXAM: CT HEAD WITHOUT CONTRAST TECHNIQUE: Contiguous axial images were obtained from the base of the skull through the vertex without intravenous contrast. COMPARISON:  Head CT dated 08/10/2017. FINDINGS: Brain: Ventricles are stable in size and configuration. All areas of the brain demonstrate normal gray-white matter  differentiation. There is no mass, hemorrhage, edema or other evidence of acute parenchymal abnormality. No extra-axial hemorrhage. Vascular: No hyperdense vessel or unexpected calcification. Skull: Normal. Negative for fracture or focal lesion. Sinuses/Orbits: No acute finding. Other: None. IMPRESSION: Negative head CT.  No intracranial mass, hemorrhage or edema. Electronically Signed   By: Bary Richard M.D.   On: 03/19/2018 22:58    Procedures Procedures (including critical care time)  Medications Ordered in ED Medications  ibuprofen (ADVIL,MOTRIN) tablet 800 mg (800 mg Oral Given 03/20/18 0007)     Initial Impression / Assessment and Plan / ED Course  I have reviewed the triage vital signs and the nursing notes.  Pertinent labs & imaging results that were available during my care of the patient were reviewed by me and considered in my medical decision making (see chart for details).  Head CT is negative  and patient is neurologically intact.  She is feeling better after receiving Motrin.  I see no indication for further work-up.  She will be discharged, to follow-up as needed.  Final Clinical Impressions(s) / ED Diagnoses   Final diagnoses:  None    ED Discharge Orders    None       Geoffery Lyons, MD 03/20/18 [phone]

## 2018-03-20 NOTE — Discharge Instructions (Addendum)
Continue ibuprofen 600 mg every 6 hours as needed for pain.  Return to the emergency department if your symptoms significantly worsen or change.

## 2018-12-23 ENCOUNTER — Encounter (HOSPITAL_COMMUNITY): Payer: Self-pay | Admitting: Emergency Medicine

## 2018-12-23 ENCOUNTER — Emergency Department (HOSPITAL_BASED_OUTPATIENT_CLINIC_OR_DEPARTMENT_OTHER): Payer: Medicare Other

## 2018-12-23 ENCOUNTER — Emergency Department (HOSPITAL_COMMUNITY)
Admission: EM | Admit: 2018-12-23 | Discharge: 2018-12-23 | Disposition: A | Discharge status: Home / Self Care | Payer: Medicare Other | Attending: Emergency Medicine | Admitting: Emergency Medicine

## 2018-12-23 ENCOUNTER — Emergency Department (HOSPITAL_BASED_OUTPATIENT_CLINIC_OR_DEPARTMENT_OTHER)
Admission: EM | Admit: 2018-12-23 | Discharge: 2018-12-23 | Disposition: A | Discharge status: Home / Self Care | Payer: Medicare Other | Attending: Emergency Medicine | Admitting: Emergency Medicine

## 2018-12-23 ENCOUNTER — Encounter (HOSPITAL_BASED_OUTPATIENT_CLINIC_OR_DEPARTMENT_OTHER): Payer: Self-pay | Admitting: *Deleted

## 2018-12-23 ENCOUNTER — Other Ambulatory Visit: Payer: Self-pay

## 2018-12-23 DIAGNOSIS — Z5321 Procedure and treatment not carried out due to patient leaving prior to being seen by health care provider: Secondary | ICD-10-CM | POA: Insufficient documentation

## 2018-12-23 DIAGNOSIS — I10 Essential (primary) hypertension: Secondary | ICD-10-CM | POA: Insufficient documentation

## 2018-12-23 DIAGNOSIS — R1110 Vomiting, unspecified: Secondary | ICD-10-CM | POA: Insufficient documentation

## 2018-12-23 DIAGNOSIS — R42 Dizziness and giddiness: Secondary | ICD-10-CM | POA: Insufficient documentation

## 2018-12-23 DIAGNOSIS — R197 Diarrhea, unspecified: Secondary | ICD-10-CM | POA: Diagnosis not present

## 2018-12-23 DIAGNOSIS — R112 Nausea with vomiting, unspecified: Secondary | ICD-10-CM

## 2018-12-23 DIAGNOSIS — R1084 Generalized abdominal pain: Secondary | ICD-10-CM | POA: Diagnosis present

## 2018-12-23 DIAGNOSIS — Z79899 Other long term (current) drug therapy: Secondary | ICD-10-CM | POA: Insufficient documentation

## 2018-12-23 LAB — CBC WITH DIFFERENTIAL/PLATELET
Abs Immature Granulocytes: 0.02 10*3/uL (ref 0.00–0.07)
Basophils Absolute: 0 10*3/uL (ref 0.0–0.1)
Basophils Relative: 0 %
EOS ABS: 0 10*3/uL (ref 0.0–0.5)
Eosinophils Relative: 0 %
HCT: 41.5 % (ref 36.0–46.0)
Hemoglobin: 13.1 g/dL (ref 12.0–15.0)
IMMATURE GRANULOCYTES: 0 %
LYMPHS ABS: 0.8 10*3/uL (ref 0.7–4.0)
Lymphocytes Relative: 12 %
MCH: 29.6 pg (ref 26.0–34.0)
MCHC: 31.6 g/dL (ref 30.0–36.0)
MCV: 93.7 fL (ref 80.0–100.0)
Monocytes Absolute: 0.2 10*3/uL (ref 0.1–1.0)
Monocytes Relative: 3 %
Neutro Abs: 5.3 10*3/uL (ref 1.7–7.7)
Neutrophils Relative %: 85 %
Platelets: 361 10*3/uL (ref 150–400)
RBC: 4.43 MIL/uL (ref 3.87–5.11)
RDW: 13.5 % (ref 11.5–15.5)
WBC: 6.3 10*3/uL (ref 4.0–10.5)
nRBC: 0 % (ref 0.0–0.2)

## 2018-12-23 LAB — URINALYSIS, ROUTINE W REFLEX MICROSCOPIC
Bilirubin Urine: NEGATIVE
Glucose, UA: NEGATIVE mg/dL
Ketones, ur: 40 mg/dL — AB
Nitrite: NEGATIVE
Protein, ur: NEGATIVE mg/dL
Specific Gravity, Urine: 1.015 (ref 1.005–1.030)
pH: 7 (ref 5.0–8.0)

## 2018-12-23 LAB — URINALYSIS, MICROSCOPIC (REFLEX)

## 2018-12-23 LAB — COMPREHENSIVE METABOLIC PANEL
ALT: 45 U/L — ABNORMAL HIGH (ref 0–44)
AST: 42 U/L — ABNORMAL HIGH (ref 15–41)
Albumin: 4.9 g/dL (ref 3.5–5.0)
Alkaline Phosphatase: 68 U/L (ref 38–126)
Anion gap: 10 (ref 5–15)
BUN: 15 mg/dL (ref 8–23)
CO2: 24 mmol/L (ref 22–32)
Calcium: 9.7 mg/dL (ref 8.9–10.3)
Chloride: 102 mmol/L (ref 98–111)
Creatinine, Ser: 0.74 mg/dL (ref 0.44–1.00)
GFR calc Af Amer: >60 mL/min (ref >60)
GFR calc non Af Amer: >60 mL/min (ref >60)
Glucose, Bld: 123 mg/dL — ABNORMAL HIGH (ref 70–99)
POTASSIUM: 3.9 mmol/L (ref 3.5–5.1)
Sodium: 136 mmol/L (ref 135–145)
TOTAL PROTEIN: 8.5 g/dL — AB (ref 6.5–8.1)
Total Bilirubin: 0.6 mg/dL (ref 0.3–1.2)

## 2018-12-23 LAB — LIPASE, BLOOD: Lipase: 27 U/L (ref 11–51)

## 2018-12-23 LAB — TROPONIN I: Troponin I: <0.03 ng/mL (ref <0.03)

## 2018-12-23 MED ORDER — SODIUM CHLORIDE 0.9 % IV BOLUS
1000.0000 mL | Freq: Once | INTRAVENOUS | Status: AC
  Administered 2018-12-23: 1000 mL via INTRAVENOUS

## 2018-12-23 MED ORDER — ONDANSETRON 4 MG PO TBDP: 4.0000 mg | ORAL_TABLET | Freq: Once | ORAL | Status: DC | PRN

## 2018-12-23 MED ORDER — ONDANSETRON HCL 4 MG/2ML IJ SOLN
4.0000 mg | Freq: Once | INTRAMUSCULAR | Status: AC
  Administered 2018-12-23: 4 mg via INTRAVENOUS
  Filled 2018-12-23: qty 2

## 2018-12-23 MED ORDER — SODIUM CHLORIDE 0.9% FLUSH: 3.0000 mL | Freq: Once | INTRAVENOUS | Status: DC

## 2018-12-23 MED ORDER — PROMETHAZINE HCL 25 MG/ML IJ SOLN
12.5000 mg | Freq: Once | INTRAMUSCULAR | Status: AC
  Administered 2018-12-23: 12.5 mg via INTRAVENOUS
  Filled 2018-12-23: qty 1

## 2018-12-23 MED ORDER — METOCLOPRAMIDE HCL 10 MG PO TABS: 10.0000 mg | ORAL_TABLET | Freq: Four times a day (QID) | ORAL | 0 refills | Status: DC | PRN

## 2018-12-23 NOTE — ED Notes (Signed)
Pt states she cannot wait anylonger 

## 2018-12-23 NOTE — ED Provider Notes (Signed)
[facility] EMERGENCY DEPARTMENT Provider Note   CSN: [id number] Arrival date & time: 12/23/18  1749    History   Chief Complaint Chief Complaint  Patient presents with  . Emesis  . Dizziness    HPI Natalie HORRY is a 73 y.o. female history of reflux, hypertension, hyperlipidemia, here presenting with abdominal pain, vomiting, dizziness.  Patient states that she woke up this morning with some generalized abdominal pain and vomiting.  She felt like she is lightheaded and dizzy but denies passing out.  Denies any chest pain or shortness of breath.  Denies any sick contacts or recent travel or eating uncooked meat. Patient went to [facility] and felt without being seen in the waiting room.      The history is provided by the patient. A language interpreter was used.    Past Medical History:  Diagnosis Date  . 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 GERD (gastroesophageal reflux disease)   . HA (headache)   . Hematuria   . Hyperlipidemia   . Hypertension   . Internal hemorrhoid   . Right ureteral stone     Patient Active Problem List   Diagnosis Date Noted  . Chest pain, precordial 07/18/2017  . Acute coronary syndrome (HCC) 07/18/2017  . Brain aneurysm 09/08/2014  . HA (headache)   . Anal pain 02/09/2014  . Chronic abdominal pain 01/23/2014    Past Surgical History:  Procedure Laterality Date  . ABDOMINAL HYSTERECTOMY    . APPENDECTOMY    . CARDIAC CATHETERIZATION  10/ 2009   DR  [facility]   NORMAL CORONARIES AND  LVSF  . COLONOSCOPY  02-15-2012  . CYSTOSCOPY WITH RETROGRADE PYELOGRAM, URETEROSCOPY AND STENT PLACEMENT Right 04/14/2014   Procedure: CYSTOSCOPY WITH RETROGRADE PYELOGRAM, URETEROSCOPY AND STENT PLACEMENT;  Surgeon: Magdalene Molly, MD;  Location: [facility];  Service: Urology;  Laterality: Right;  . TRANSTHORACIC ECHOCARDIOGRAM  10-19-2008   NORMAL STUDY/  EF 55%     OB  History   No obstetric history on file.      Home Medications    Prior to Admission medications   Medication Sig Start Date End Date Taking? Authorizing Provider  albuterol (PROVENTIL HFA;VENTOLIN HFA) 108 (90 BASE) MCG/ACT inhaler Inhale 2 puffs into the lungs every 6 (six) hours as needed for wheezing.    [provider]  amLODipine (NORVASC) 5 MG tablet Take 5 mg by mouth daily.    [provider]  atorvastatin (LIPITOR) 40 MG tablet Take 40 mg by mouth daily.    [provider]  budesonide-formoterol (SYMBICORT) 160-4.5 MCG/ACT inhaler Inhale 2 puffs into the lungs 2 (two) times daily.    [provider]  cetirizine (ZYRTEC) 10 MG tablet Take 10 mg by mouth daily.    [provider]  famotidine (PEPCID) 10 MG tablet Take 10 mg by mouth daily.    [provider]  fluticasone (FLONASE) 50 MCG/ACT nasal spray Place 2 sprays into both nostrils at bedtime.    [provider]  lisinopril (PRINIVIL,ZESTRIL) 40 MG tablet Take 40 mg by mouth daily.    [provider]  zolpidem (AMBIEN) 10 MG tablet Take 10 mg by mouth at bedtime.    [provider]    Family History Family History  Problem Relation Age of Onset  . Heart attack Father     Social  History Social History   Tobacco Use  . Smoking status: Never Smoker  . Smokeless tobacco: Never Used  Substance Use Topics  . Alcohol use: No  . Drug use: No     Allergies   Contrast media [iodinated diagnostic agents]; Dilaudid [hydromorphone hcl]; and Hydromorphone   Review of Systems Review of Systems  Gastrointestinal: Positive for vomiting.  Neurological: Positive for dizziness.  All other systems reviewed and are negative.    Physical Exam Updated Vital Signs BP (!) 150/68 (BP Location: Right Arm)   Pulse 84   Temp 98.1 F (36.7 C) (Oral)   Resp 20   Ht 5\' 1"  (1.549 m)   Wt 59 kg   SpO2 97%   BMI 24.56 kg/m   Physical Exam Vitals  signs and nursing note reviewed.  HENT:     Head: Normocephalic.     Right Ear: Tympanic membrane normal.     Left Ear: Tympanic membrane normal.     Mouth/Throat:     Mouth: Mucous membranes are dry.  Eyes:     Extraocular Movements: Extraocular movements intact.     Pupils: Pupils are equal, round, and reactive to light.  Neck:     Musculoskeletal: Normal range of motion.  Cardiovascular:     Rate and Rhythm: Normal rate.  Pulmonary:     Effort: Pulmonary effort is normal.     Breath sounds: Normal breath sounds.  Abdominal:     General: Abdomen is flat.     Palpations: Abdomen is soft.     Comments: Mild diffuse lower abdominal tenderness, no rebound   Musculoskeletal: Normal range of motion.  Skin:    General: Skin is warm.     Capillary Refill: Capillary refill takes less than 2 seconds.  Neurological:     General: No focal deficit present.     Mental Status: She is alert and oriented to person, place, and time.  Psychiatric:        Mood and Affect: Mood normal.        Behavior: Behavior normal.      ED Treatments / Results  Labs (all labs ordered are listed, but only abnormal results are displayed) Labs Reviewed  COMPREHENSIVE METABOLIC PANEL - Abnormal; Notable for the following components:      Result Value   Glucose, Bld 123 (*)    Total Protein 8.5 (*)    AST 42 (*)    ALT 45 (*)    All other components within normal limits  CBC WITH DIFFERENTIAL/PLATELET  TROPONIN I  LIPASE, BLOOD  URINALYSIS, ROUTINE W REFLEX MICROSCOPIC    EKG EKG Interpretation  Date/Time:  Tuesday December 23 2018 20:44:43 EST Ventricular Rate:  87 PR Interval:    QRS Duration: 85 QT Interval:  384 QTC Calculation: 462 R Axis:   67 Text Interpretation:  Sinus rhythm Low voltage, precordial leads Abnormal R-wave progression, early transition Nonspecific repol abnormality, anterior leads No significant change since last tracing Confirmed by Richardean Canal ([id number]) on 12/23/2018  8:48:26 PM   Radiology Ct Renal Stone Study  Result Date: 12/23/2018 CLINICAL DATA:  73 year old female with lower abdominal pain, nausea vomiting onset today. EXAM: CT ABDOMEN AND PELVIS WITHOUT CONTRAST TECHNIQUE: Multidetector CT imaging of the abdomen and pelvis was performed following the standard protocol without IV contrast. COMPARISON:  CT Abdomen and Pelvis 518. FINDINGS: Lower chest: Mild cardiomegaly. Lower lung volumes with minor lung base atelectasis. No pericardial or pleural effusion. Hepatobiliary: Possible hepatic steatosis  but otherwise negative noncontrast liver and gallbladder. Pancreas: Negative. Spleen: Negative. Adrenals/Urinary Tract: Normal adrenal glands. Left renal benign parapelvic cysts again suspected. Decompressed and normal proximal left ureter. Negative course of the left ureter to the bladder. Chronic pelvic phleboliths. No left nephrolithiasis. Punctate contralateral right upper nephrolithiasis on coronal image 69. No right hydronephrosis or perinephric stranding. Decompressed and normal proximal right ureter. Negative course of the right ureter. Unremarkable urinary bladder. Stomach/Bowel: Negative rectum. Mild to moderate sigmoid diverticulosis with no active inflammation. Decompressed and negative descending and transverse colon. Decompressed and negative right colon. Negative terminal ileum. Appendix is diminutive or absent. No dilated small bowel. Possible small gastric hiatal hernia but otherwise negative stomach. No free air, free fluid. Vascular/Lymphatic: Mild Calcified aortic atherosclerosis. Vascular patency is not evaluated in the absence of IV contrast. No lymphadenopathy. Reproductive: Diminutive or absent uterus and adnexa. Other: No pelvic free fluid. Musculoskeletal: Sacral Tarlov cyst, normal variant. No acute osseous abnormality identified. IMPRESSION: 1. Punctate right nephrolithiasis. No obstructive uropathy. 2. No acute or inflammatory process identified  in the non-contrast abdomen or pelvis. 3. Sigmoid diverticulosis. Electronically Signed   By: Odessa FlemingH  Hall M.D.   On: 12/23/2018 21:12    Procedures Procedures (including critical care time)  Medications Ordered in ED Medications  sodium chloride 0.9 % bolus 1,000 mL (1,000 mLs Intravenous New Bag/Given 12/23/18 2042)  ondansetron (ZOFRAN) injection 4 mg (4 mg Intravenous Given 12/23/18 2043)     Initial Impression / Assessment and Plan / ED Course  I have reviewed the triage vital signs and the nursing notes.  Pertinent labs & imaging results that were available during my care of the patient were reviewed by me and considered in my medical decision making (see chart for details).       Natalie Willis is a 73 y.o. female here with vomiting, abdominal pain. Likely viral gastro. She does have mild diffuse lower abdominal tenderness. She also has IV contrast allergy. Will get non contrast CT ab/pel, labs, UA. Will give IVF, zofran and reassess.   10:53 PM Labs unremarkable. Felt better after IVF and antiemetics. CT showed intra renal stone with no hydro. I doubt renal colic. Likely viral gastro. Tolerated PO in the ED. Will dc home with reglan prn.    Final Clinical Impressions(s) / ED Diagnoses   Final diagnoses:  None    ED Discharge Orders    None       Charlynne PanderYao,  Hsienta, MD 12/23/18 2254

## 2018-12-23 NOTE — ED Notes (Signed)
Pt continues to vomit, text sent to provder

## 2018-12-23 NOTE — ED Notes (Signed)
PO challenge given  

## 2018-12-23 NOTE — Discharge Instructions (Signed)
Stay hydrated.   Take reglan as needed for nausea and vomiting.   You likely have a stomach virus.   See your doctor   Return to ER if you have worse vomiting, abdominal pain, fever.

## 2018-12-23 NOTE — ED Notes (Signed)
Patient transported to CT 

## 2018-12-23 NOTE — ED Triage Notes (Signed)
Pt has been generalized abd pain with emesis since this morning, dizziness. A&O x4.

## 2018-12-23 NOTE — ED Triage Notes (Signed)
Pt has been vomiting and dizzy since 5:00 am.

## 2019-11-16 ENCOUNTER — Other Ambulatory Visit: Payer: Self-pay

## 2019-11-16 ENCOUNTER — Emergency Department (HOSPITAL_BASED_OUTPATIENT_CLINIC_OR_DEPARTMENT_OTHER)
Admission: EM | Admit: 2019-11-16 | Discharge: 2019-11-16 | Disposition: A | Discharge status: Home / Self Care | Payer: Medicare Other | Attending: Emergency Medicine | Admitting: Emergency Medicine

## 2019-11-16 ENCOUNTER — Emergency Department (HOSPITAL_BASED_OUTPATIENT_CLINIC_OR_DEPARTMENT_OTHER): Payer: Medicare Other

## 2019-11-16 ENCOUNTER — Encounter (HOSPITAL_BASED_OUTPATIENT_CLINIC_OR_DEPARTMENT_OTHER): Payer: Self-pay | Admitting: Emergency Medicine

## 2019-11-16 DIAGNOSIS — R4182 Altered mental status, unspecified: Secondary | ICD-10-CM | POA: Insufficient documentation

## 2019-11-16 DIAGNOSIS — I10 Essential (primary) hypertension: Secondary | ICD-10-CM | POA: Diagnosis not present

## 2019-11-16 DIAGNOSIS — R519 Headache, unspecified: Secondary | ICD-10-CM | POA: Insufficient documentation

## 2019-11-16 DIAGNOSIS — R0789 Other chest pain: Secondary | ICD-10-CM | POA: Diagnosis not present

## 2019-11-16 DIAGNOSIS — Z79899 Other long term (current) drug therapy: Secondary | ICD-10-CM | POA: Insufficient documentation

## 2019-11-16 DIAGNOSIS — R040 Epistaxis: Secondary | ICD-10-CM | POA: Insufficient documentation

## 2019-11-16 LAB — CBC WITH DIFFERENTIAL/PLATELET
Abs Immature Granulocytes: 0.01 10*3/uL (ref 0.00–0.07)
Basophils Absolute: 0.1 10*3/uL (ref 0.0–0.1)
Basophils Relative: 1 %
Eosinophils Absolute: 0.3 10*3/uL (ref 0.0–0.5)
Eosinophils Relative: 4 %
HCT: 39.9 % (ref 36.0–46.0)
Hemoglobin: 12.5 g/dL (ref 12.0–15.0)
Immature Granulocytes: 0 %
Lymphocytes Relative: 51 %
Lymphs Abs: 4.2 10*3/uL — ABNORMAL HIGH (ref 0.7–4.0)
MCH: 30 pg (ref 26.0–34.0)
MCHC: 31.3 g/dL (ref 30.0–36.0)
MCV: 95.7 fL (ref 80.0–100.0)
Monocytes Absolute: 0.6 10*3/uL (ref 0.1–1.0)
Monocytes Relative: 8 %
Neutro Abs: 2.9 10*3/uL (ref 1.7–7.7)
Neutrophils Relative %: 36 %
Platelets: 296 10*3/uL (ref 150–400)
RBC: 4.17 MIL/uL (ref 3.87–5.11)
RDW: 12.7 % (ref 11.5–15.5)
WBC: 8 10*3/uL (ref 4.0–10.5)
nRBC: 0 % (ref 0.0–0.2)

## 2019-11-16 LAB — TROPONIN I (HIGH SENSITIVITY)
Troponin I (High Sensitivity): 2 ng/L (ref <18)
Troponin I (High Sensitivity): 5 ng/L (ref <18)

## 2019-11-16 LAB — BASIC METABOLIC PANEL
Anion gap: 10 (ref 5–15)
BUN: 19 mg/dL (ref 8–23)
CO2: 25 mmol/L (ref 22–32)
Calcium: 9.5 mg/dL (ref 8.9–10.3)
Chloride: 107 mmol/L (ref 98–111)
Creatinine, Ser: 0.88 mg/dL (ref 0.44–1.00)
GFR calc Af Amer: >60 mL/min (ref >60)
GFR calc non Af Amer: >60 mL/min (ref >60)
Glucose, Bld: 127 mg/dL — ABNORMAL HIGH (ref 70–99)
Potassium: 3.6 mmol/L (ref 3.5–5.1)
Sodium: 142 mmol/L (ref 135–145)

## 2019-11-16 LAB — CBG MONITORING, ED: Glucose-Capillary: 100 mg/dL — ABNORMAL HIGH (ref 70–99)

## 2019-11-16 MED ORDER — METOCLOPRAMIDE HCL 5 MG/ML IJ SOLN
5.0000 mg | Freq: Once | INTRAMUSCULAR | Status: AC
  Administered 2019-11-16: 5 mg via INTRAVENOUS
  Filled 2019-11-16: qty 2

## 2019-11-16 MED ORDER — DIPHENHYDRAMINE HCL 50 MG/ML IJ SOLN
12.5000 mg | Freq: Once | INTRAMUSCULAR | Status: AC
  Administered 2019-11-16: 12.5 mg via INTRAVENOUS
  Filled 2019-11-16: qty 1

## 2019-11-16 MED ORDER — ONDANSETRON HCL 4 MG/2ML IJ SOLN
INTRAMUSCULAR | Status: AC
  Filled 2019-11-16: qty 2

## 2019-11-16 MED ORDER — ONDANSETRON HCL 4 MG/2ML IJ SOLN
4.0000 mg | Freq: Once | INTRAMUSCULAR | Status: AC
  Administered 2019-11-16: 4 mg via INTRAVENOUS

## 2019-11-16 NOTE — ED Notes (Signed)
Pt responsive , resting in stretcher. States she does not like to take her medicine for headaches.

## 2019-11-16 NOTE — ED Notes (Signed)
ED Provider at bedside. 

## 2019-11-16 NOTE — ED Notes (Signed)
Pt with eyes closed on stretcher. Skin warm , dry. VSS. Dried blood in left nare. Pt speaks spanish with some english. Daughter at bedside for history. Pt lives alone , daughter states she became quiet and would not answer her during ride hear.

## 2019-11-16 NOTE — ED Notes (Signed)
Patient transported to CT with Charge RN

## 2019-11-16 NOTE — ED Triage Notes (Signed)
Pt dropped off by family for headache, passing out and bloody nose. Family reports hx of aneurysm. Family reports headache for a week, bloody nose tonight.

## 2019-11-16 NOTE — ED Provider Notes (Signed)
[facility] EMERGENCY DEPARTMENT Provider Note   CSN: [id number] Arrival date & time: 11/16/19  0117     History Chief Complaint  Patient presents with  . Headache    Natalie Willis is a 74 y.o. female.  HPI     This is a 74 year old female with a history of aneurysm of the ophthalmic artery, headaches, hypertension, hyperlipidemia who presents with headache and bloody nose.  Reported headache on and off over the last 1 week.  Currently rates her pain at 9 out of 10.  She has a history of headaches with similar character.  She describes the pain at the top of her head that does not radiate.  She reports nausea without vomiting.  Denies any weakness, numbness, strokelike symptoms.  She did note a nosebleed.  This is new for her.  It is mostly out of her right naris.  Family also describes an episode where "she was not with it."  No seizure-like activity noted.  She did not truly pass out or syncopized.  She did not appear confused but just not responsive at her baseline.  She also endorses some chest discomfort at this time.  She report relates it to her nausea.  Denies any shortness of breath or recent fevers.  Past Medical History:  Diagnosis Date  . 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 GERD (gastroesophageal reflux disease)   . HA (headache)   . Hematuria   . Hyperlipidemia   . Hypertension   . Internal hemorrhoid   . Right ureteral stone     Patient Active Problem List   Diagnosis Date Noted  . Chest pain, precordial 07/18/2017  . Acute coronary syndrome (HCC) 07/18/2017  . Brain aneurysm 09/08/2014  . HA (headache)   . Anal pain 02/09/2014  . Chronic abdominal pain 01/23/2014    Past Surgical History:  Procedure Laterality Date  . ABDOMINAL HYSTERECTOMY    . APPENDECTOMY    . CARDIAC CATHETERIZATION  10/ 2009   DR  [facility]   NORMAL CORONARIES AND  LVSF  . COLONOSCOPY  02-15-2012  .  CYSTOSCOPY WITH RETROGRADE PYELOGRAM, URETEROSCOPY AND STENT PLACEMENT Right 04/14/2014   Procedure: CYSTOSCOPY WITH RETROGRADE PYELOGRAM, URETEROSCOPY AND STENT PLACEMENT;  Surgeon: Magdalene Molly, MD;  Location: [facility];  Service: Urology;  Laterality: Right;  . TRANSTHORACIC ECHOCARDIOGRAM  10-19-2008   NORMAL STUDY/  EF 55%     OB History   No obstetric history on file.     Family History  Problem Relation Age of Onset  . Heart attack Father     Social History   Tobacco Use  . Smoking status: Never Smoker  . Smokeless tobacco: Never Used  Substance Use Topics  . Alcohol use: No  . Drug use: No    Home Medications Prior to Admission medications   Medication Sig Start Date End Date Taking? Authorizing Provider  amLODipine (NORVASC) 5 MG tablet Take 5 mg by mouth daily.   Yes [provider]  lisinopril (PRINIVIL,ZESTRIL) 40 MG tablet Take 40 mg by mouth daily.   Yes [provider]  zolpidem (AMBIEN) 10 MG tablet Take 10 mg by mouth at bedtime.   Yes [provider]  albuterol (PROVENTIL HFA;VENTOLIN HFA) 108 (90 BASE) MCG/ACT inhaler Inhale 2 puffs into the lungs every 6 (six) hours as needed for wheezing.  [provider]  atorvastatin (LIPITOR) 40 MG tablet Take 40 mg by mouth daily.    [provider]  budesonide-formoterol (SYMBICORT) 160-4.5 MCG/ACT inhaler Inhale 2 puffs into the lungs 2 (two) times daily.    [provider]  cetirizine (ZYRTEC) 10 MG tablet Take 10 mg by mouth daily.    [provider]  famotidine (PEPCID) 10 MG tablet Take 10 mg by mouth daily.    [provider]  fluticasone (FLONASE) 50 MCG/ACT nasal spray Place 2 sprays into both nostrils at bedtime.    [provider]  metoCLOPramide (REGLAN) 10 MG tablet Take 1 tablet (10 mg total) by mouth every 6 (six) hours as needed for nausea (nausea/headache). 12/23/18   Charlynne Pander, MD     Allergies    Contrast media [iodinated diagnostic agents], Dilaudid [hydromorphone hcl], and Hydromorphone  Review of Systems   Review of Systems  Constitutional: Negative for fever.  Respiratory: Negative for shortness of breath.   Cardiovascular: Positive for chest pain. Negative for leg swelling.  Gastrointestinal: Positive for nausea. Negative for abdominal pain, diarrhea and vomiting.  Neurological: Positive for headaches. Negative for dizziness, seizures, syncope and weakness.  All other systems reviewed and are negative.   Physical Exam Updated Vital Signs BP 129/61   Pulse 73   Temp 99 F (37.2 C) (Oral)   Resp 19   Ht 1.549 m (5\' 1" )   Wt 58.9 kg   SpO2 96%   BMI 24.54 kg/m   Physical Exam Vitals and nursing note reviewed.  Constitutional:      General: She is not in acute distress.    Appearance: She is well-developed. She is not ill-appearing.  HENT:     Head: Normocephalic and atraumatic.     Comments: Dried blood noted in the right naris, no septal hematoma noted    Mouth/Throat:     Mouth: Mucous membranes are moist.  Eyes:     Pupils: Pupils are equal, round, and reactive to light.  Cardiovascular:     Rate and Rhythm: Normal rate and regular rhythm.     Heart sounds: Normal heart sounds.  Pulmonary:     Effort: Pulmonary effort is normal. No respiratory distress.     Breath sounds: No wheezing.  Abdominal:     General: Bowel sounds are normal.     Palpations: Abdomen is soft.     Tenderness: There is no abdominal tenderness.  Musculoskeletal:        General: No swelling.     Cervical back: Neck supple.  Skin:    General: Skin is warm and dry.  Neurological:     Mental Status: She is alert and oriented to person, place, and time.     Comments: Cranial nerves II through XII intact, fluent speech, 5 out of 5 strength in all 4 extremities, no dysmetria to finger-nose-finger     ED Results / Procedures / Treatments   Labs (all labs ordered  are listed, but only abnormal results are displayed) Labs Reviewed  CBC WITH DIFFERENTIAL/PLATELET - Abnormal; Notable for the following components:      Result Value   Lymphs Abs 4.2 (*)    All other components within normal limits  BASIC METABOLIC PANEL - Abnormal; Notable for the following components:   Glucose, Bld 127 (*)    All other components within normal limits  CBG MONITORING, ED - Abnormal; Notable for the following components:   Glucose-Capillary 100 (*)  All other components within normal limits  TROPONIN I (HIGH SENSITIVITY)  TROPONIN I (HIGH SENSITIVITY)    EKG EKG Interpretation  Date/Time:  Monday November 16 2019 01:32:15 EST Ventricular Rate:  91 PR Interval:    QRS Duration: 81 QT Interval:  337 QTC Calculation: 415 R Axis:   84 Text Interpretation: Sinus rhythm Borderline right axis deviation Abnormal R-wave progression, early transition Borderline repol abnormality, diffuse leads Baseline wander in lead(s) I III aVL No significant change since last tracing Confirmed by Ross Marcus ([id number]) on 11/16/2019 1:42:08 AM   Radiology CT Head Wo Contrast  Result Date: 11/16/2019 CLINICAL DATA:  Headache for 1 week. EXAM: CT HEAD WITHOUT CONTRAST TECHNIQUE: Contiguous axial images were obtained from the base of the skull through the vertex without intravenous contrast. COMPARISON:  03/19/2018 FINDINGS: Brain: There is no mass, hemorrhage or extra-axial collection. The size and configuration of the ventricles and extra-axial CSF spaces are normal. The brain parenchyma is normal, without acute or chronic infarction. Vascular: No abnormal hyperdensity of the major intracranial arteries or dural venous sinuses. No intracranial atherosclerosis. Skull: The visualized skull base, calvarium and extracranial soft tissues are normal. Sinuses/Orbits: No fluid levels or advanced mucosal thickening of the visualized paranasal sinuses. No mastoid or middle ear effusion. The orbits  are normal. IMPRESSION: Normal head CT. Electronically Signed   By: Deatra Robinson M.D.   On: 11/16/2019 01:48   DG Chest Portable 1 View  Result Date: 11/16/2019 CLINICAL DATA:  74 year old female with chest pain, syncope, epistaxis. EXAM: PORTABLE CHEST 1 VIEW COMPARISON:  Chest radiographs 03/26/2019 and earlier. FINDINGS: Portable AP semi upright view at 0139 hours. Mildly lower lung volumes. Mediastinal contours remain normal. Visualized tracheal air column is within normal limits. Allowing for portable technique the lungs are clear. Paucity of bowel gas in the upper abdomen. Stable visualized osseous structures. IMPRESSION: Negative portable chest. Electronically Signed   By: Odessa Fleming M.D.   On: 11/16/2019 01:54    Procedures Procedures (including critical care time)  Medications Ordered in ED Medications  ondansetron (ZOFRAN) injection 4 mg (4 mg Intravenous Given 11/16/19 0129)  metoCLOPramide (REGLAN) injection 5 mg (5 mg Intravenous Given 11/16/19 0148)  diphenhydrAMINE (BENADRYL) injection 12.5 mg (12.5 mg Intravenous Given 11/16/19 0148)    ED Course  I have reviewed the triage vital signs and the nursing notes.  Pertinent labs & imaging results that were available during my care of the patient were reviewed by me and considered in my medical decision making (see chart for details).  Clinical Course as of Nov 15 404  Mon Nov 16, 2019  5208 On recheck, patient feels much better.  CT and labs reviewed with patient and her family.  Will await repeat troponin.   [CH]    Clinical Course User Index [CH] Shiara Mcgough, Mayer Masker, MD   MDM Rules/Calculators/A&P                       Patient presents with headache.  Worsened on and off for the last week.  Also reports nosebleed and some atypical chest pain.  She is overall nontoxic-appearing and neurologically intact.  No red flags.  Family reports an episode of alteration of consciousness but no syncope or seizure activity noted.  Blood  sugars are reassuring.  CT of the head shows no evidence of intracranial bleed and given a nonfocal neuro logic exam, feel this is reassuring.  Have low suspicion for subarachnoid hemorrhage.  Patient  was given a migraine cocktail.  Regarding her chest pain, EKG shows no evidence of acute ischemia.  Chest x-ray shows no evidence of pneumothorax or pneumonia.  Troponin x2 is negative.  On multiple rechecks, patient reports improvement of her symptoms and is resting comfortably.  After history, exam, and medical workup I feel the patient has been appropriately medically screened and is safe for discharge home. Pertinent diagnoses were discussed with the patient. Patient was given return precautions.   Final Clinical Impression(s) / ED Diagnoses Final diagnoses:  Bad headache  Atypical chest pain    Rx / DC Orders ED Discharge Orders    None       Arrow Emmerich, Barbette Hair, MD 11/16/19 [phone]

## 2019-11-16 NOTE — Discharge Instructions (Addendum)
You were seen today for a headache.  Your work-up is reassuring.  Given the chest pain you had, you were also worked up for this.  Your chest imaging and testing is negative.  Follow-up with your primary physician.

## 2019-11-16 NOTE — ED Triage Notes (Signed)
CBG 100 

## 2019-11-16 NOTE — ED Notes (Signed)
PT back from CT. Speech clear, able to move all extremities. Urinated in bedpan No active nose bleed. Daughter at bedside.

## 2020-07-20 ENCOUNTER — Other Ambulatory Visit: Payer: Self-pay | Admitting: Cardiovascular Disease

## 2020-07-20 ENCOUNTER — Ambulatory Visit
Admission: RE | Admit: 2020-07-20 | Discharge: 2020-07-20 | Disposition: A | Discharge status: Home / Self Care | Payer: Medicaid Other | Source: Ambulatory Visit | Attending: Cardiovascular Disease | Admitting: Cardiovascular Disease

## 2020-07-20 DIAGNOSIS — R059 Cough, unspecified: Secondary | ICD-10-CM

## 2020-07-20 DIAGNOSIS — R0602 Shortness of breath: Secondary | ICD-10-CM

## 2020-09-18 ENCOUNTER — Encounter (HOSPITAL_BASED_OUTPATIENT_CLINIC_OR_DEPARTMENT_OTHER): Payer: Self-pay

## 2020-09-18 ENCOUNTER — Emergency Department (HOSPITAL_BASED_OUTPATIENT_CLINIC_OR_DEPARTMENT_OTHER): Payer: Medicare Other

## 2020-09-18 ENCOUNTER — Other Ambulatory Visit: Payer: Self-pay

## 2020-09-18 ENCOUNTER — Emergency Department (HOSPITAL_BASED_OUTPATIENT_CLINIC_OR_DEPARTMENT_OTHER)
Admission: EM | Admit: 2020-09-18 | Discharge: 2020-09-18 | Disposition: A | Discharge status: Home / Self Care | Payer: Medicare Other | Attending: Emergency Medicine | Admitting: Emergency Medicine

## 2020-09-18 DIAGNOSIS — I10 Essential (primary) hypertension: Secondary | ICD-10-CM | POA: Diagnosis not present

## 2020-09-18 DIAGNOSIS — G4486 Cervicogenic headache: Secondary | ICD-10-CM | POA: Diagnosis not present

## 2020-09-18 DIAGNOSIS — M542 Cervicalgia: Secondary | ICD-10-CM | POA: Insufficient documentation

## 2020-09-18 DIAGNOSIS — H9203 Otalgia, bilateral: Secondary | ICD-10-CM | POA: Insufficient documentation

## 2020-09-18 DIAGNOSIS — R519 Headache, unspecified: Secondary | ICD-10-CM | POA: Diagnosis present

## 2020-09-18 MED ORDER — AMOXICILLIN 500 MG PO CAPS: 500.0000 mg | ORAL_CAPSULE | Freq: Three times a day (TID) | ORAL | 0 refills | Status: AC

## 2020-09-18 MED ORDER — GABAPENTIN 100 MG PO CAPS: 100.0000 mg | ORAL_CAPSULE | Freq: Three times a day (TID) | ORAL | 1 refills | Status: DC

## 2020-09-18 NOTE — ED Triage Notes (Signed)
Pt is spanish speaking, triage was completed with interpreter services  Pt reports that she has had a headache for about a week with worsening pain last night. Pt reports taking tylenol for pain with no relief. Pt denies any blurred vision, trouble speaking, or weakness.

## 2020-09-18 NOTE — ED Provider Notes (Signed)
[facility] Emergency Department Provider Note MRN:  [id number]  Arrival date & time: 09/18/20     Chief Complaint   Headache   History of Present Illness   Natalie Willis is a 74 y.o. year-old female with a history of hypertension presenting to the ED with chief complaint of headache.  3 weeks of pain to the left side of the head and neck with radiation into the left shoulder.  Worse with motion of the neck or arm, worse with palpation to the area.  Denies any trauma.  No vision change or vision loss, endorsing occasional paresthesia to the left arm.  No numbness or weakness to the arms or legs, no chest pain or shortness of breath, no abdominal pain.  Also endorsing 3 months of bilateral ear pain.  No fever.  Review of Systems  A complete 10 system review of systems was obtained and all systems are negative except as noted in the HPI and PMH.   Patient's Health History    Past Medical History:  Diagnosis Date  . 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 GERD (gastroesophageal reflux disease)   . HA (headache)   . Hematuria   . Hyperlipidemia   . Hypertension   . Internal hemorrhoid   . Right ureteral stone     Past Surgical History:  Procedure Laterality Date  . ABDOMINAL HYSTERECTOMY    . APPENDECTOMY    . CARDIAC CATHETERIZATION  10/ 2009   DR  [facility]   NORMAL CORONARIES AND  LVSF  . COLONOSCOPY  02-15-2012  . CYSTOSCOPY WITH RETROGRADE PYELOGRAM, URETEROSCOPY AND STENT PLACEMENT Right 04/14/2014   Procedure: CYSTOSCOPY WITH RETROGRADE PYELOGRAM, URETEROSCOPY AND STENT PLACEMENT;  Surgeon: Magdalene Molly, MD;  Location: [facility];  Service: Urology;  Laterality: Right;  . TRANSTHORACIC ECHOCARDIOGRAM  10-19-2008   NORMAL STUDY/  EF 55%    Family History  Problem Relation Age of Onset  . Heart attack Father     Social History   Socioeconomic History  .  Marital status: Legally Separated    Spouse name: Not on file  . Number of children: Not on file  . Years of education: Not on file  . Highest education level: Not on file  Occupational History  . Not on file  Tobacco Use  . Smoking status: Never Smoker  . Smokeless tobacco: Never Used  Substance and Sexual Activity  . Alcohol use: No  . Drug use: No  . Sexual activity: Not on file  Other Topics Concern  . Not on file  Social History Narrative  . Not on file   Social Determinants of Health   Financial Resource Strain:   . Difficulty of Paying Living Expenses: Not on file  Food Insecurity:   . Worried About Programme researcher, broadcasting/film/video in the Last Year: Not on file  . Ran Out of Food in the Last Year: Not on file  Transportation Needs:   . Lack of Transportation (Medical): Not on file  . Lack of Transportation (Non-Medical): Not on file  Physical Activity:   . Days of Exercise per Week: Not on file  . Minutes of Exercise per Session: Not on file  Stress:   . Feeling of Stress : Not on file  Social Connections:   . Frequency of Communication with Friends and Family: Not on file  .  Frequency of Social Gatherings with Friends and Family: Not on file  . Attends Religious Services: Not on file  . Active Member of Clubs or Organizations: Not on file  . Attends Banker Meetings: Not on file  . Marital Status: Not on file  Intimate Partner Violence:   . Fear of Current or Ex-Partner: Not on file  . Emotionally Abused: Not on file  . Physically Abused: Not on file  . Sexually Abused: Not on file     Physical Exam   Vitals:   09/18/20 1435 09/18/20 1603  BP: (!) 147/68 (!) 159/77  Pulse: 81 78  Resp: 19 16  Temp: 98.3 F (36.8 C)   SpO2: 98% 97%    CONSTITUTIONAL: Well-appearing, NAD NEURO:  Alert and oriented x 3, no focal deficits EYES:  eyes equal and reactive ENT/NECK:  no LAD, no JVD CARDIO: Regular rate, well-perfused, normal S1 and S2 PULM:  CTAB no  wheezing or rhonchi GI/GU:  normal bowel sounds, non-distended, non-tender MSK/SPINE:  No gross deformities, no edema SKIN:  no rash, atraumatic PSYCH:  Appropriate speech and behavior  *Additional and/or pertinent findings included in MDM below  Diagnostic and Interventional Summary    EKG Interpretation  Date/Time:    Ventricular Rate:    PR Interval:    QRS Duration:   QT Interval:    QTC Calculation:   R Axis:     Text Interpretation:        Labs Reviewed - No data to display  CT HEAD WO CONTRAST  Final Result    CT Cervical Spine Wo Contrast  Final Result      Medications - No data to display   Procedures  /  Critical Care Procedures  ED Course and Medical Decision Making  I have reviewed the triage vital signs, the nursing notes, and pertinent available records from the EMR.  Listed above are laboratory and imaging tests that I personally ordered, reviewed, and interpreted and then considered in my medical decision making (see below for details).  Chronic headache which is best categorized as likely muscular strain or sprain or perhaps a peripheral neuropathy of some sort.  She has tenderness and pain with motion to the neck and shoulder.  She has no numbness or weakness to the arms or legs, no bowel or bladder dysfunction, nothing to suggest myelopathy.  Her neurological exam is very reassuring and there is unlikely to be a CNS component.  She has no visual acuity deficits and therefore temporal arteritis is largely excluded especially when you consider the chronicity of the symptoms.  Her TMs appear relatively normal, I do not have a good explanation for her 3 months of ear pain.  Overall I doubt emergent process.  Given her age and headache and possible paresthesia to the left arm occasionally, will obtain screening CT head and neck without contrast but I think patient is best served following up with her primary care doctor and/or ENT.     CT imaging is  reassuring, after further review patient symptoms seem most consistent with a cervicogenic headache.  Providing gabapentin, neurology follow-up.  Upon reassessment her TMs do appear inflamed, will provide amoxicillin.  Elmer Sow. Pilar Plate, MD [facility] Emergency Medicine [facility] [email or web address]  Final Clinical Impressions(s) / ED Diagnoses     ICD-10-CM   1. Otalgia of both ears  H92.03   2. Cervicogenic headache  G44.86     ED Discharge Orders  Ordered    amoxicillin (AMOXIL) 500 MG capsule  3 times daily        09/18/20 1751    gabapentin (NEURONTIN) 100 MG capsule  3 times daily        09/18/20 1751           Discharge Instructions Discussed with and Provided to Patient:     Discharge Instructions     You were evaluated in the Emergency Department and after careful evaluation, we did not find any emergent condition requiring admission or further testing in the hospital.  Your exam/testing today was overall reassuring.  Your symptoms seem to be due to a particular type of headache related to nerve pain.  Please take the gabapentin medication as needed for pain.  Please also take the amoxicillin antibiotic for your ear pain as directed.  We recommend follow-up with the neurologists for your headache symptoms  Please return to the Emergency Department if you experience any worsening of your condition.  Thank you for allowing [place] to be a part of your care.        Sabas Sous, MD 09/18/20 1759

## 2020-09-18 NOTE — Discharge Instructions (Signed)
You were evaluated in the Emergency Department and after careful evaluation, we did not find any emergent condition requiring admission or further testing in the hospital.  Your exam/testing today was overall reassuring.  Your symptoms seem to be due to a particular type of headache related to nerve pain.  Please take the gabapentin medication as needed for pain.  Please also take the amoxicillin antibiotic for your ear pain as directed.  We recommend follow-up with the neurologists for your headache symptoms  Please return to the Emergency Department if you experience any worsening of your condition.  Thank you for allowing [place] to be a part of your care.

## 2021-06-17 ENCOUNTER — Other Ambulatory Visit: Payer: Self-pay

## 2021-06-17 ENCOUNTER — Emergency Department (HOSPITAL_BASED_OUTPATIENT_CLINIC_OR_DEPARTMENT_OTHER): Payer: Medicare Other

## 2021-06-17 ENCOUNTER — Encounter (HOSPITAL_BASED_OUTPATIENT_CLINIC_OR_DEPARTMENT_OTHER): Payer: Self-pay | Admitting: Emergency Medicine

## 2021-06-17 ENCOUNTER — Emergency Department (HOSPITAL_BASED_OUTPATIENT_CLINIC_OR_DEPARTMENT_OTHER)
Admission: EM | Admit: 2021-06-17 | Discharge: 2021-06-17 | Disposition: A | Discharge status: Home / Self Care | Payer: Medicare Other | Attending: Emergency Medicine | Admitting: Emergency Medicine

## 2021-06-17 DIAGNOSIS — R0789 Other chest pain: Secondary | ICD-10-CM | POA: Diagnosis not present

## 2021-06-17 DIAGNOSIS — K219 Gastro-esophageal reflux disease without esophagitis: Secondary | ICD-10-CM | POA: Insufficient documentation

## 2021-06-17 DIAGNOSIS — H1131 Conjunctival hemorrhage, right eye: Secondary | ICD-10-CM | POA: Insufficient documentation

## 2021-06-17 DIAGNOSIS — M546 Pain in thoracic spine: Secondary | ICD-10-CM | POA: Insufficient documentation

## 2021-06-17 DIAGNOSIS — Z79899 Other long term (current) drug therapy: Secondary | ICD-10-CM | POA: Insufficient documentation

## 2021-06-17 DIAGNOSIS — I10 Essential (primary) hypertension: Secondary | ICD-10-CM | POA: Insufficient documentation

## 2021-06-17 DIAGNOSIS — M549 Dorsalgia, unspecified: Secondary | ICD-10-CM

## 2021-06-17 DIAGNOSIS — R079 Chest pain, unspecified: Secondary | ICD-10-CM

## 2021-06-17 LAB — BASIC METABOLIC PANEL
Anion gap: 10 (ref 5–15)
BUN: 17 mg/dL (ref 8–23)
CO2: 25 mmol/L (ref 22–32)
Calcium: 9.4 mg/dL (ref 8.9–10.3)
Chloride: 105 mmol/L (ref 98–111)
Creatinine, Ser: 0.93 mg/dL (ref 0.44–1.00)
GFR, Estimated: >60 mL/min (ref >60)
Glucose, Bld: 119 mg/dL — ABNORMAL HIGH (ref 70–99)
Potassium: 3.8 mmol/L (ref 3.5–5.1)
Sodium: 140 mmol/L (ref 135–145)

## 2021-06-17 LAB — CBC
HCT: 39.4 % (ref 36.0–46.0)
Hemoglobin: 13 g/dL (ref 12.0–15.0)
MCH: 30.4 pg (ref 26.0–34.0)
MCHC: 33 g/dL (ref 30.0–36.0)
MCV: 92.1 fL (ref 80.0–100.0)
Platelets: 345 10*3/uL (ref 150–400)
RBC: 4.28 MIL/uL (ref 3.87–5.11)
RDW: 13.7 % (ref 11.5–15.5)
WBC: 7.4 10*3/uL (ref 4.0–10.5)
nRBC: 0 % (ref 0.0–0.2)

## 2021-06-17 LAB — TROPONIN I (HIGH SENSITIVITY): Troponin I (High Sensitivity): 3 ng/L (ref <18)

## 2021-06-17 NOTE — Discharge Instructions (Addendum)
You were seen in the emergency department for upper back pain chest pain and blood in your right eye.  There was no evidence of any heart injury.  Your chest x-ray also looked normal.  The redness in the eyes should heal over a few days.  Please contact your primary care doctor for follow-up.  Return to the emergency department if any worsening or concerning symptoms

## 2021-06-17 NOTE — ED Provider Notes (Signed)
[facility] EMERGENCY DEPARTMENT Provider Note   CSN: [id number] Arrival date & time: 06/17/21  1820     History Chief Complaint  Patient presents with   Chest Pain    Natalie Willis is a 75 y.o. female.  She is primarily Spanish-speaking and interpreter iPad is used.  She is complaining of some left upper back pain that started today while she was in the kitchen.  Worse with movement.  Yesterday she had a pain that was under her left breast that went away.  Her daughter noted that her right eye was red.  She feels her vision is a little blurry.  No eye trauma that she recalls.  No eye pain.  No headache numbness weakness shortness of breath.  No abdominal pain vomiting or diarrhea.  The history is provided by the patient. The history is limited by a language barrier. A language interpreter was used (ipad adrianne).  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   12/ 2009--  stable per last MRI  06-24-2013   GERD (gastroesophageal reflux disease)    HA (headache)    Hematuria    Hyperlipidemia    Hypertension    Internal hemorrhoid    Right ureteral stone     Patient Active Problem List   Diagnosis Date Noted   Chest pain, precordial 07/18/2017   Acute coronary syndrome (HCC) 07/18/2017   Brain aneurysm 09/08/2014   HA (headache)    Anal pain 02/09/2014   Chronic abdominal  pain 01/23/2014    Past Surgical History:  Procedure Laterality Date   ABDOMINAL HYSTERECTOMY     APPENDECTOMY     CARDIAC CATHETERIZATION  10/ 2009   DR  [facility]   NORMAL CORONARIES AND  LVSF   COLONOSCOPY  02-15-2012   CYSTOSCOPY WITH RETROGRADE PYELOGRAM, URETEROSCOPY AND STENT PLACEMENT Right 04/14/2014   Procedure: CYSTOSCOPY WITH RETROGRADE PYELOGRAM, URETEROSCOPY AND STENT PLACEMENT;  Surgeon: Magdalene Molly, MD;  Location: [facility];  Service: Urology;  Laterality: Right;   TRANSTHORACIC ECHOCARDIOGRAM  10-19-2008   NORMAL STUDY/  EF 55%     OB History   No obstetric history on file.     Family History  Problem Relation Age of Onset   Heart attack Father     Social History   Tobacco Use   Smoking status: Never   Smokeless tobacco: Never  Substance Use Topics   Alcohol use: No   Drug use: No    Home Medications Prior to Admission medications   Medication Sig Start Date End Date Taking? Authorizing Provider  albuterol (PROVENTIL HFA;VENTOLIN HFA) 108 (90 BASE) MCG/ACT inhaler Inhale 2 puffs into the lungs every 6 (six) hours as needed for wheezing.    [provider]  amLODipine (NORVASC) 5 MG tablet Take 5 mg by mouth daily.  [provider]  atorvastatin (LIPITOR) 40 MG tablet Take 40 mg by mouth daily.    [provider]  budesonide-formoterol (SYMBICORT) 160-4.5 MCG/ACT inhaler Inhale 2 puffs into the lungs 2 (two) times daily.    [provider]  cetirizine (ZYRTEC) 10 MG tablet Take 10 mg by mouth daily.    [provider]  famotidine (PEPCID) 10 MG tablet Take 10 mg by mouth daily.    [provider]  fluticasone (FLONASE) 50 MCG/ACT nasal spray Place 2 sprays into both nostrils at bedtime.    [provider]  gabapentin (NEURONTIN) 100 MG capsule Take 1 capsule (100 mg total) by mouth 3 (three) times daily. 09/18/20   Sabas Sous, MD  lisinopril (PRINIVIL,ZESTRIL) 40 MG  tablet Take 40 mg by mouth daily.    [provider]  metoCLOPramide (REGLAN) 10 MG tablet Take 1 tablet (10 mg total) by mouth every 6 (six) hours as needed for nausea (nausea/headache). 12/23/18   Charlynne Pander, MD  zolpidem (AMBIEN) 10 MG tablet Take 10 mg by mouth at bedtime.    [provider]    Allergies    Contrast media [iodinated diagnostic agents], Dilaudid [hydromorphone hcl], and Hydromorphone  Review of Systems   Review of Systems  Constitutional:  Negative for fever.  HENT:  Negative for sore throat.   Eyes:  Positive for redness and visual disturbance.  Respiratory:  Negative for shortness of breath.   Cardiovascular:  Positive for chest pain.  Gastrointestinal:  Negative for abdominal pain and bowel incontinence.  Genitourinary:  Negative for bladder incontinence and dysuria.  Musculoskeletal:  Positive for back pain. Negative for neck pain.  Skin:  Negative for rash.  Neurological:  Negative for weakness and numbness.   Physical Exam Updated Vital Signs BP (!) 159/77 (BP Location: Right Arm)   Pulse 92   Temp 98.6 F (37 C) (Oral)   Resp 20   Ht 5\' 1"  (1.549 m)   Wt 63 kg   SpO2 97%   BMI 26.26 kg/m   Physical Exam Vitals and nursing note reviewed.  Constitutional:      General: She is not in acute distress.    Appearance: She is well-developed.  HENT:     Head: Normocephalic and atraumatic.  Eyes:     General: Lids are normal.     Extraocular Movements: Extraocular movements intact.     Conjunctiva/sclera: Conjunctivae normal.     Comments: Anterior chamber clear and deep bilaterally.  She has some minimal some conjunctival hemorrhage on the nasal aspect of her right sclera.  Cardiovascular:     Rate and Rhythm: Normal rate and regular rhythm.     Heart sounds: Normal heart sounds. No murmur heard. Pulmonary:     Effort: Pulmonary effort is normal. No respiratory distress.     Breath sounds: Normal breath sounds.  Abdominal:      Palpations: Abdomen is soft.     Tenderness: There is no abdominal tenderness.  Musculoskeletal:        General: Normal range of motion.     Cervical back: Neck supple.     Right lower leg: No tenderness. No edema.     Left lower leg: No tenderness. No edema.     Comments: She has some reproducible tenderness left parathoracic musculature.  Palpation reproduces her pain.  Skin:    General: Skin is warm and dry.  Neurological:     Mental Status: She is alert.  ED Results / Procedures / Treatments   Labs (all labs ordered are listed, but only abnormal results are displayed) Labs Reviewed  BASIC METABOLIC PANEL - Abnormal; Notable for the following components:      Result Value   Glucose, Bld 119 (*)    All other components within normal limits  CBC  TROPONIN I (HIGH SENSITIVITY)    EKG EKG Interpretation  Date/Time:  Saturday June 17 2021 18:37:46 EDT Ventricular Rate:  91 PR Interval:  142 QRS Duration: 66 QT Interval:  362 QTC Calculation: 445 R Axis:   66 Text Interpretation: Normal sinus rhythm Nonspecific ST and T wave abnormality Abnormal ECG No significant change since prior 1/21 Confirmed by Meridee Score [phone]) on 06/17/2021 6:48:32 PM  Radiology DG Chest Port 1 View  Result Date: 06/17/2021 CLINICAL DATA:  Left-sided chest pain EXAM: PORTABLE CHEST 1 VIEW COMPARISON:  07/20/2020 FINDINGS: The heart size and mediastinal contours are within normal limits. Both lungs are clear. The visualized skeletal structures are unremarkable. IMPRESSION: No active disease. Electronically Signed   By: Alcide Clever M.D.   On: 06/17/2021 20:02    Procedures Procedures   Medications Ordered in ED Medications - No data to display  ED Course  I have reviewed the triage vital signs and the nursing notes.  Pertinent labs & imaging results that were available during my care of the patient were reviewed by me and considered in my medical decision making (see chart for  details).    MDM Rules/Calculators/A&P                          This patient complains of left posterior chest pain, right eye blurriness and redness; this involves an extensive number of treatment Options and is a complaint that carries with it a high risk of complications and Morbidity. The differential includes ACS, pneumonia, PE, pneumothorax, musculoskeletal, subconjunctival hemorrhage, eye foreign body  I ordered, reviewed and interpreted labs, which included CBC with normal white count normal hemoglobin, chemistries normal, troponin unremarkable I ordered imaging studies which included chest x-ray and I independently    visualized and interpreted imaging which showed no acute findings Additional history obtained from patient's daughter Previous records obtained and reviewed in epic no recent admissions  After the interventions stated above, I reevaluated the patient and found patient to be well-appearing nontoxic and with stable vital signs.  Reviewed results of work-up with her and her family member.  Recommended symptomatic treatment and follow-up with PCP.  Return instructions discussed   Final Clinical Impression(s) / ED Diagnoses Final diagnoses:  Upper back pain on left side  Nonspecific chest pain  Subconjunctival hemorrhage of right eye    Rx / DC Orders ED Discharge Orders     None        Terrilee Files, MD 06/18/21 1009

## 2021-06-17 NOTE — ED Triage Notes (Addendum)
Pt arrives pov with c/o L upper back pain and R eye pain. Pt reports pain with deep inspiration, endorses CP under left breast using translator. Conjunctiva redness noted in R eye. Translator [place] # [id number], Spanish

## 2021-07-31 ENCOUNTER — Emergency Department (HOSPITAL_COMMUNITY)
Admission: EM | Admit: 2021-07-31 | Discharge: 2021-07-31 | Disposition: A | Discharge status: Home / Self Care | Payer: Medicare Other | Attending: Emergency Medicine | Admitting: Emergency Medicine

## 2021-07-31 ENCOUNTER — Other Ambulatory Visit: Payer: Self-pay

## 2021-07-31 ENCOUNTER — Emergency Department (HOSPITAL_COMMUNITY): Payer: Medicare Other

## 2021-07-31 DIAGNOSIS — I10 Essential (primary) hypertension: Secondary | ICD-10-CM | POA: Diagnosis not present

## 2021-07-31 DIAGNOSIS — Z79899 Other long term (current) drug therapy: Secondary | ICD-10-CM | POA: Insufficient documentation

## 2021-07-31 DIAGNOSIS — U071 COVID-19: Secondary | ICD-10-CM | POA: Insufficient documentation

## 2021-07-31 DIAGNOSIS — R0602 Shortness of breath: Secondary | ICD-10-CM | POA: Diagnosis present

## 2021-07-31 LAB — CBC WITH DIFFERENTIAL/PLATELET
Abs Immature Granulocytes: 0.01 10*3/uL (ref 0.00–0.07)
Basophils Absolute: 0 10*3/uL (ref 0.0–0.1)
Basophils Relative: 1 %
Eosinophils Absolute: 0 10*3/uL (ref 0.0–0.5)
Eosinophils Relative: 0 %
HCT: 40.2 % (ref 36.0–46.0)
Hemoglobin: 12.9 g/dL (ref 12.0–15.0)
Immature Granulocytes: 0 %
Lymphocytes Relative: 25 %
Lymphs Abs: 1.8 10*3/uL (ref 0.7–4.0)
MCH: 30 pg (ref 26.0–34.0)
MCHC: 32.1 g/dL (ref 30.0–36.0)
MCV: 93.5 fL (ref 80.0–100.0)
Monocytes Absolute: 0.4 10*3/uL (ref 0.1–1.0)
Monocytes Relative: 6 %
Neutro Abs: 4.8 10*3/uL (ref 1.7–7.7)
Neutrophils Relative %: 68 %
Platelets: 286 10*3/uL (ref 150–400)
RBC: 4.3 MIL/uL (ref 3.87–5.11)
RDW: 13.9 % (ref 11.5–15.5)
WBC: 7.1 10*3/uL (ref 4.0–10.5)
nRBC: 0 % (ref 0.0–0.2)

## 2021-07-31 LAB — RESP PANEL BY RT-PCR (FLU A&B, COVID) ARPGX2
Influenza A by PCR: NEGATIVE
Influenza B by PCR: NEGATIVE
SARS Coronavirus 2 by RT PCR: POSITIVE — AB

## 2021-07-31 LAB — BRAIN NATRIURETIC PEPTIDE: B Natriuretic Peptide: 27.3 pg/mL (ref 0.0–100.0)

## 2021-07-31 LAB — TROPONIN I (HIGH SENSITIVITY): Troponin I (High Sensitivity): 3 ng/L (ref <18)

## 2021-07-31 MED ORDER — GUAIFENESIN-CODEINE 100-10 MG/5ML PO SOLN: 10.0000 mL | Freq: Four times a day (QID) | ORAL | 0 refills | Status: DC | PRN

## 2021-07-31 MED ORDER — METHYLPREDNISOLONE SODIUM SUCC 125 MG IJ SOLR
125.0000 mg | Freq: Once | INTRAMUSCULAR | Status: AC
  Administered 2021-07-31: 125 mg via INTRAVENOUS
  Filled 2021-07-31: qty 2

## 2021-07-31 MED ORDER — IPRATROPIUM-ALBUTEROL 0.5-2.5 (3) MG/3ML IN SOLN
3.0000 mL | Freq: Once | RESPIRATORY_TRACT | Status: AC
  Administered 2021-07-31: 3 mL via RESPIRATORY_TRACT
  Filled 2021-07-31: qty 3

## 2021-07-31 MED ORDER — ALBUTEROL SULFATE HFA 108 (90 BASE) MCG/ACT IN AERS: 2.0000 | INHALATION_SPRAY | RESPIRATORY_TRACT | Status: DC | PRN

## 2021-07-31 MED ORDER — NIRMATRELVIR/RITONAVIR (PAXLOVID)TABLET: 3.0000 | ORAL_TABLET | Freq: Two times a day (BID) | ORAL | 0 refills | Status: AC

## 2021-07-31 NOTE — Discharge Instructions (Addendum)
Begin taking Paxlovid as prescribed.  Begin taking Robitussin with codeine as prescribed as needed for cough.  Primarily take this medication at night as it will make you drowsy.  Isolate at home for the next 5 days.  Return to the emergency department if you develop severe chest pain, difficulty breathing, or other new and concerning symptoms.

## 2021-07-31 NOTE — ED Provider Notes (Signed)
[facility]-EMERGENCY DEPT Provider Note   CSN: [id number] Arrival date & time: 07/31/21  0138     History Chief Complaint  Patient presents with   Shortness of Breath   Weakness   Cough    Natalie Willis is a 75 y.o. female.  Patient is a 76 year old female with past medical history of asthma, hypertension, hyperlipidemia.  Patient presenting today with complaints of shortness of breath.  She describes a several day history of cough that is intermittently productive with blood-tinged sputum.  She denies fevers or chills.  She has been using her nebulizer with little relief.  She denies ill contacts.  The history is provided by the patient.      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   12/ 2009--  stable per last MRI  06-24-2013   GERD (gastroesophageal reflux disease)    HA (headache)    Hematuria    Hyperlipidemia    Hypertension    Internal hemorrhoid    Right ureteral stone     Patient Active Problem List   Diagnosis Date Noted   Chest pain, precordial 07/18/2017   Acute coronary syndrome (HCC) 07/18/2017   Brain aneurysm 09/08/2014   HA (headache)    Anal pain 02/09/2014   Chronic abdominal pain 01/23/2014    Past Surgical History:  Procedure Laterality Date   ABDOMINAL HYSTERECTOMY     APPENDECTOMY     CARDIAC CATHETERIZATION  10/ 2009   DR  [facility]   NORMAL CORONARIES AND  LVSF   COLONOSCOPY  02-15-2012   CYSTOSCOPY WITH RETROGRADE PYELOGRAM, URETEROSCOPY AND STENT PLACEMENT Right 04/14/2014   Procedure: CYSTOSCOPY WITH RETROGRADE PYELOGRAM, URETEROSCOPY AND STENT PLACEMENT;  Surgeon: Magdalene Molly, MD;  Location: [facility];  Service: Urology;  Laterality: Right;   TRANSTHORACIC ECHOCARDIOGRAM  10-19-2008   NORMAL STUDY/  EF 55%     OB History   No obstetric history on file.     Family History  Problem Relation Age of Onset   Heart attack Father      Social History   Tobacco Use   Smoking status: Never   Smokeless tobacco: Never  Substance Use Topics   Alcohol use: No   Drug use: No    Home Medications Prior to Admission medications   Medication Sig Start Date End Date Taking? Authorizing Provider  albuterol (PROVENTIL HFA;VENTOLIN HFA) 108 (90 BASE) MCG/ACT inhaler Inhale 2 puffs into the lungs every 6 (six) hours as needed for wheezing.    [provider]  amLODipine (NORVASC) 5 MG tablet Take 5 mg by mouth daily.    [provider]  atorvastatin (LIPITOR) 40 MG tablet Take 40 mg by mouth daily.    [provider]  budesonide-formoterol (SYMBICORT) 160-4.5 MCG/ACT inhaler Inhale 2 puffs into the lungs 2 (two) times daily.    [provider]  cetirizine (ZYRTEC) 10 MG tablet Take 10 mg by mouth daily.    [provider]  famotidine (PEPCID) 10 MG tablet Take 10 mg by mouth daily.    [provider]  fluticasone (FLONASE) 50 MCG/ACT nasal spray Place 2 sprays into both nostrils at bedtime.    [provider]  gabapentin (NEURONTIN) 100 MG capsule Take 1 capsule (100 mg total) by mouth 3 (three) times daily. 09/18/20   Sabas Sous, MD  lisinopril (PRINIVIL,ZESTRIL) 40 MG tablet Take 40 mg  by mouth daily.    [provider]  metoCLOPramide (REGLAN) 10 MG tablet Take 1 tablet (10 mg total) by mouth every 6 (six) hours as needed for nausea (nausea/headache). 12/23/18   Charlynne Pander, MD  zolpidem (AMBIEN) 10 MG tablet Take 10 mg by mouth at bedtime.    [provider]    Allergies    Contrast media [iodinated diagnostic agents], Corn-containing products, Dilaudid [hydromorphone hcl], and Hydromorphone  Review of Systems   Review of Systems  All other systems reviewed and are negative.  Physical Exam Updated Vital Signs BP (!) 148/79 (BP Location: Right Arm)   Pulse 85   Temp 98.3 F (36.8 C) (Oral)   Resp (!) 31   Ht 5\' 1"  (1.549 m)    Wt 57.2 kg   SpO2 99%   BMI 23.81 kg/m   Physical Exam Vitals and nursing note reviewed.  Constitutional:      General: She is not in acute distress.    Appearance: She is well-developed. She is not diaphoretic.  HENT:     Head: Normocephalic and atraumatic.  Cardiovascular:     Rate and Rhythm: Normal rate and regular rhythm.     Heart sounds: No murmur heard.   No friction rub. No gallop.  Pulmonary:     Effort: Pulmonary effort is normal. No tachypnea, accessory muscle usage or respiratory distress.     Breath sounds: No stridor. Examination of the right-middle field reveals rhonchi. Examination of the left-middle field reveals rhonchi. Rhonchi present. No wheezing.  Abdominal:     General: Bowel sounds are normal. There is no distension.     Palpations: Abdomen is soft.     Tenderness: There is no abdominal tenderness.  Musculoskeletal:        General: Normal range of motion.     Cervical back: Normal range of motion and neck supple.  Skin:    General: Skin is warm and dry.  Neurological:     General: No focal deficit present.     Mental Status: She is alert and oriented to person, place, and time.    ED Results / Procedures / Treatments   Labs (all labs ordered are listed, but only abnormal results are displayed) Labs Reviewed  RESP PANEL BY RT-PCR (FLU A&B, COVID) [id number]  BRAIN NATRIURETIC PEPTIDE  CBC WITH DIFFERENTIAL/PLATELET  TROPONIN I (HIGH SENSITIVITY)    EKG EKG Interpretation  Date/Time:  Monday July 31 2021 02:24:18 EDT Ventricular Rate:  84 PR Interval:  151 QRS Duration: 61 QT Interval:  381 QTC Calculation: 451 R Axis:   61 Text Interpretation: Sinus rhythm Low voltage, precordial leads Abnormal R-wave progression, early transition Nonspecific T abnormalities, diffuse leads Confirmed by 07-28-1991 (Geoffery Lyons) on 07/31/2021 2:33:15 AM  Radiology No results found.  Procedures Procedures   Medications Ordered in ED Medications   albuterol (VENTOLIN HFA) 108 (90 Base) MCG/ACT inhaler 2 puff (has no administration in time range)  methylPREDNISolone sodium succinate (SOLU-MEDROL) 125 mg/2 mL injection 125 mg (has no administration in time range)  ipratropium-albuterol (DUONEB) 0.5-2.5 (3) MG/3ML nebulizer solution 3 mL (has no administration in time range)    ED Course  I have reviewed the triage vital signs and the nursing notes.  Pertinent labs & imaging results that were available during my care of the patient were reviewed by me and considered in my medical decision making (see chart for details).    MDM Rules/Calculators/A&P  Patient is a 75 year old female presenting  with complaints of shortness of breath and cough.  Her chest x-ray is clear and laboratory studies are unremarkable.  Patient's COVID test did return positive.  She reports having COVID the beginning of July and I suspect that this is new infection as opposed to a residual positive test.  Patient to be treated with Paxlovid and Robitussin with codeine.  She is to isolate at home and return as needed if symptoms worsen.  There is no hypoxia or respiratory distress and she is nontoxic in appearance.  Natalie Willis was evaluated in Emergency Department on 07/31/2021 for the symptoms described in the history of present illness. She was evaluated in the context of the global COVID-19 pandemic, which necessitated consideration that the patient might be at risk for infection with the SARS-CoV-2 virus that causes COVID-19. Institutional protocols and algorithms that pertain to the evaluation of patients at risk for COVID-19 are in a state of rapid change based on information released by regulatory bodies including the CDC and federal and state organizations. These policies and algorithms were followed during the patient's care in the ED.   Final Clinical Impression(s) / ED Diagnoses Final diagnoses:  None    Rx / DC Orders ED Discharge Orders     None         Geoffery Lyons, MD 07/31/21 [phone]

## 2021-07-31 NOTE — ED Triage Notes (Signed)
Pt came from home via EMS. C/c: weakness x 7 days, SOB at rest, chest pain upon deep inhalation. Pt is not vaccinated against covid. Pt has had covid twice. A&O x 4, ambulatory. PMH of HTN and asthma, pt has inhaler at home and pt reports it does not work for her.  137/70 92 HR 16 RR 100% on RA

## 2021-08-21 ENCOUNTER — Other Ambulatory Visit: Payer: Self-pay | Admitting: Cardiovascular Disease

## 2021-08-21 ENCOUNTER — Other Ambulatory Visit: Payer: Self-pay

## 2021-08-21 ENCOUNTER — Ambulatory Visit
Admission: RE | Admit: 2021-08-21 | Discharge: 2021-08-21 | Disposition: A | Discharge status: Home / Self Care | Payer: Medicare Other | Source: Ambulatory Visit | Attending: Cardiovascular Disease | Admitting: Cardiovascular Disease

## 2021-08-21 DIAGNOSIS — M25562 Pain in left knee: Secondary | ICD-10-CM

## 2021-08-21 DIAGNOSIS — M25561 Pain in right knee: Secondary | ICD-10-CM

## 2021-10-16 ENCOUNTER — Other Ambulatory Visit: Payer: Self-pay | Admitting: Cardiovascular Disease

## 2021-10-16 ENCOUNTER — Ambulatory Visit
Admission: RE | Admit: 2021-10-16 | Discharge: 2021-10-16 | Disposition: A | Discharge status: Home / Self Care | Payer: Medicare Other | Source: Ambulatory Visit | Attending: Cardiovascular Disease | Admitting: Cardiovascular Disease

## 2021-10-16 DIAGNOSIS — R109 Unspecified abdominal pain: Secondary | ICD-10-CM

## 2022-03-14 ENCOUNTER — Emergency Department (HOSPITAL_BASED_OUTPATIENT_CLINIC_OR_DEPARTMENT_OTHER): Payer: Medicare Other

## 2022-03-14 ENCOUNTER — Other Ambulatory Visit: Payer: Self-pay

## 2022-03-14 ENCOUNTER — Encounter (HOSPITAL_BASED_OUTPATIENT_CLINIC_OR_DEPARTMENT_OTHER): Payer: Self-pay

## 2022-03-14 ENCOUNTER — Emergency Department (HOSPITAL_BASED_OUTPATIENT_CLINIC_OR_DEPARTMENT_OTHER)
Admission: EM | Admit: 2022-03-14 | Discharge: 2022-03-14 | Disposition: A | Discharge status: Home / Self Care | Payer: Medicare Other | Attending: Emergency Medicine | Admitting: Emergency Medicine

## 2022-03-14 DIAGNOSIS — I729 Aneurysm of unspecified site: Secondary | ICD-10-CM | POA: Insufficient documentation

## 2022-03-14 DIAGNOSIS — M26609 Unspecified temporomandibular joint disorder, unspecified side: Secondary | ICD-10-CM | POA: Insufficient documentation

## 2022-03-14 DIAGNOSIS — R519 Headache, unspecified: Secondary | ICD-10-CM | POA: Diagnosis present

## 2022-03-14 DIAGNOSIS — Z7982 Long term (current) use of aspirin: Secondary | ICD-10-CM | POA: Diagnosis not present

## 2022-03-14 DIAGNOSIS — R42 Dizziness and giddiness: Secondary | ICD-10-CM | POA: Insufficient documentation

## 2022-03-14 LAB — CBC WITH DIFFERENTIAL/PLATELET
Abs Immature Granulocytes: 0.01 10*3/uL (ref 0.00–0.07)
Basophils Absolute: 0.1 10*3/uL (ref 0.0–0.1)
Basophils Relative: 1 %
Eosinophils Absolute: 0.2 10*3/uL (ref 0.0–0.5)
Eosinophils Relative: 3 %
HCT: 39.8 % (ref 36.0–46.0)
Hemoglobin: 13.1 g/dL (ref 12.0–15.0)
Immature Granulocytes: 0 %
Lymphocytes Relative: 39 %
Lymphs Abs: 1.9 10*3/uL (ref 0.7–4.0)
MCH: 30.4 pg (ref 26.0–34.0)
MCHC: 32.9 g/dL (ref 30.0–36.0)
MCV: 92.3 fL (ref 80.0–100.0)
Monocytes Absolute: 0.4 10*3/uL (ref 0.1–1.0)
Monocytes Relative: 8 %
Neutro Abs: 2.4 10*3/uL (ref 1.7–7.7)
Neutrophils Relative %: 49 %
Platelets: 341 10*3/uL (ref 150–400)
RBC: 4.31 MIL/uL (ref 3.87–5.11)
RDW: 13.5 % (ref 11.5–15.5)
WBC: 4.9 10*3/uL (ref 4.0–10.5)
nRBC: 0 % (ref 0.0–0.2)

## 2022-03-14 LAB — BASIC METABOLIC PANEL
Anion gap: 7 (ref 5–15)
BUN: 13 mg/dL (ref 8–23)
CO2: 25 mmol/L (ref 22–32)
Calcium: 9.7 mg/dL (ref 8.9–10.3)
Chloride: 109 mmol/L (ref 98–111)
Creatinine, Ser: 0.87 mg/dL (ref 0.44–1.00)
GFR, Estimated: >60 mL/min (ref >60)
Glucose, Bld: 111 mg/dL — ABNORMAL HIGH (ref 70–99)
Potassium: 4.3 mmol/L (ref 3.5–5.1)
Sodium: 141 mmol/L (ref 135–145)

## 2022-03-14 LAB — TROPONIN I (HIGH SENSITIVITY)
Troponin I (High Sensitivity): 2 ng/L (ref <18)
Troponin I (High Sensitivity): 2 ng/L (ref <18)

## 2022-03-14 LAB — SEDIMENTATION RATE: Sed Rate: 9 mm/hr (ref 0–22)

## 2022-03-14 MED ORDER — DIPHENHYDRAMINE HCL 50 MG/ML IJ SOLN
50.0000 mg | Freq: Once | INTRAMUSCULAR | Status: AC
  Administered 2022-03-14: 50 mg via INTRAVENOUS
  Filled 2022-03-14: qty 1

## 2022-03-14 MED ORDER — METHYLPREDNISOLONE SODIUM SUCC 40 MG IJ SOLR
40.0000 mg | Freq: Once | INTRAMUSCULAR | Status: AC
  Administered 2022-03-14: 40 mg via INTRAVENOUS

## 2022-03-14 MED ORDER — METOCLOPRAMIDE HCL 5 MG/ML IJ SOLN
INTRAMUSCULAR | Status: AC
  Filled 2022-03-14: qty 2

## 2022-03-14 MED ORDER — METHYLPREDNISOLONE SODIUM SUCC 40 MG IJ SOLR
INTRAMUSCULAR | Status: AC
  Filled 2022-03-14: qty 1

## 2022-03-14 MED ORDER — METOCLOPRAMIDE HCL 5 MG/ML IJ SOLN
10.0000 mg | Freq: Once | INTRAMUSCULAR | Status: AC
  Administered 2022-03-14: 10 mg via INTRAVENOUS

## 2022-03-14 MED ORDER — KETOROLAC TROMETHAMINE 15 MG/ML IJ SOLN
INTRAMUSCULAR | Status: AC
  Filled 2022-03-14: qty 1

## 2022-03-14 MED ORDER — DIPHENHYDRAMINE HCL 25 MG PO CAPS: 50.0000 mg | ORAL_CAPSULE | Freq: Once | ORAL | Status: AC

## 2022-03-14 MED ORDER — KETOROLAC TROMETHAMINE 15 MG/ML IJ SOLN
15.0000 mg | Freq: Once | INTRAMUSCULAR | Status: AC
  Administered 2022-03-14: 15 mg via INTRAVENOUS

## 2022-03-14 MED ORDER — IOHEXOL 350 MG/ML SOLN
80.0000 mL | Freq: Once | INTRAVENOUS | Status: AC | PRN
  Administered 2022-03-14: 80 mL via INTRAVENOUS

## 2022-03-14 NOTE — ED Notes (Signed)
Patient transported to CT 

## 2022-03-14 NOTE — ED Provider Notes (Signed)
?[facility] EMERGENCY DEPARTMENT ?Provider Note ? ? ?CSN: [id number] ?Arrival date & time: 03/14/22  1157 ? ?  ? ?History ? ?Chief Complaint  ?Patient presents with  ? Headache  ? ? ?Natalie Willis is a 76 y.o. female. ? ?HPI ? ?  ?76 year old female comes in with chief complaint of headache. ?Patient has history of brain aneurysm and chronic headaches.  Patient is on gabapentin. ? ?Patient indicates that she has had recurrent headaches for the last several months.  However more recently, she has noted that the symptoms have been more severe.  She has also started having some jaw pain.  This morning she woke up, the headache was more severe than usual and she had associated blurred vision, nausea, dizziness.  Patient did not feel like she was going to pass out. ? ?Patient has known history of ophthalmic artery aneurysm.  She is following up with neurology for it and has had repeat imaging that has been reassuring.  ? ?Review of system is negative for any vision loss, one-sided numbness, weakness ? ?Home Medications ?Prior to Admission medications   ?Medication Sig Start Date End Date Taking? Authorizing Provider  ?albuterol (PROVENTIL HFA;VENTOLIN HFA) 108 (90 BASE) MCG/ACT inhaler Inhale 2 puffs into the lungs every 6 (six) hours as needed for wheezing.    [provider]  ?alendronate (FOSAMAX) 70 MG tablet Take 1 tablet by mouth every 7 (seven) days. Thursdays 03/14/21   [provider]  ?amLODipine (NORVASC) 10 MG tablet Take 1 tablet by mouth daily. 03/29/21   [provider]  ?amLODipine (NORVASC) 5 MG tablet Take 5 mg by mouth daily. ?Patient not taking: Reported on 07/31/2021    [provider]  ?aspirin 81 MG EC tablet Take 81 mg by mouth daily.    [provider]  ?atorvastatin (LIPITOR) 40 MG tablet Take 40 mg by mouth daily.    [provider]  ?budesonide-formoterol (SYMBICORT) 160-4.5 MCG/ACT inhaler Inhale 2 puffs into the lungs 2 (two)  times daily.    [provider]  ?cetirizine (ZYRTEC) 10 MG tablet Take 10 mg by mouth daily.    [provider]  ?dicyclomine (BENTYL) 20 MG tablet Take 20 mg by mouth 3 (three) times daily as needed. 05/04/21   [provider]  ?escitalopram (LEXAPRO) 5 MG tablet Take 5 mg by mouth daily. 05/27/21   [provider]  ?famotidine (PEPCID) 10 MG tablet Take 10 mg by mouth daily.    [provider]  ?fluticasone (FLONASE) 50 MCG/ACT nasal spray Place 2 sprays into both nostrils at bedtime.    [provider]  ?gabapentin (NEURONTIN) 100 MG capsule Take 1 capsule (100 mg total) by mouth 3 (three) times daily. ?Patient not taking: No sig reported 09/18/20   Maudie Flakes, MD  ?guaiFENesin-codeine 100-10 MG/5ML syrup Take 10 mLs by mouth every 6 (six) hours as needed for cough. 07/31/21   Veryl Speak, MD  ?isosorbide mononitrate (IMDUR) 30 MG 24 hr tablet Take 30 mg by mouth daily. 05/25/21   [provider]  ?lisinopril (ZESTRIL) 10 MG tablet Take 10 mg by mouth daily.    [provider]  ?metoCLOPramide (REGLAN) 10 MG tablet Take 1 tablet (10 mg total) by mouth every 6 (six) hours as needed for nausea (nausea/headache). ?Patient not taking: No sig reported 12/23/18   Drenda Freeze, MD  ?zolpidem (AMBIEN) 10 MG tablet Take 10 mg by mouth at bedtime.    [provider]  ?   ? ?Allergies    ?Contrast media [iodinated contrast media], Corn-containing products, Milk-related compounds, Dilaudid [hydromorphone hcl], Hydromorphone, and Morphine   ? ?Review of Systems   ?Review of Systems  ?All other systems reviewed and are negative. ? ?Physical Exam ?Updated Vital Signs ?BP 139/69   Pulse 80   Temp 98.1 ?F (36.7 ?C) (Oral)   Resp 18   Wt 57.8 kg   SpO2 96%   BMI 24.08 kg/m?  ?Physical Exam ?Vitals and nursing note reviewed.  ?Constitutional:   ?   Appearance: She is well-developed.  ?HENT:  ?   Head: Atraumatic.  ?   Comments: Worsening  pain with manipulation of the TMJ ?Eyes:  ?   General: No visual field deficit. ?   Extraocular Movements: Extraocular movements intact.  ?   Pupils: Pupils are equal, round, and reactive to light.  ?   Comments: No nystagmus, no visual field cuts  ?Cardiovascular:  ?   Rate and Rhythm: Normal rate.  ?   Comments: No meningismus ?Pulmonary:  ?   Effort: Pulmonary effort is normal.  ?Musculoskeletal:  ?   Cervical back: Normal range of motion and neck supple.  ?Skin: ?   General: Skin is warm and dry.  ?Neurological:  ?   Mental Status: She is alert and oriented to person, place, and time.  ?   GCS: GCS eye subscore is 4. GCS verbal subscore is 5. GCS motor subscore is 6.  ?   Cranial Nerves: No cranial nerve deficit, dysarthria or facial asymmetry.  ?   Sensory: No sensory deficit.  ?   Motor: No weakness.  ?   Coordination: Coordination normal.  ? ? ?ED Results / Procedures / Treatments   ?Labs ?(all labs ordered are listed, but only abnormal results are displayed) ?Labs Reviewed  ?BASIC METABOLIC PANEL - Abnormal; Notable for the following components:  ?    Result Value  ? Glucose, Bld 111 (*)   ? All other components within normal limits  ?CBC WITH DIFFERENTIAL/PLATELET  ?SEDIMENTATION RATE  ?TROPONIN I (HIGH SENSITIVITY)  ?TROPONIN I (HIGH SENSITIVITY)  ? ? ?EKG ?EKG Interpretation ? ?Date/Time:  Wednesday Mar 14 2022 12:26:51 EDT ?Ventricular Rate:  72 ?PR Interval:  130 ?QRS Duration: 75 ?QT Interval:  401 ?QTC Calculation: 439 ?R Axis:   67 ?Text Interpretation: Sinus rhythm Low voltage, precordial leads Borderline repolarization abnormality No acute changes No significant change since last tracing Confirmed by Varney Biles [phone]) on 03/14/2022 1:02:47 PM ? ?Radiology ?CT Head Wo Contrast ? ?Result Date: 03/14/2022 ?CLINICAL DATA:  Headache, new or worsening EXAM: CT HEAD WITHOUT CONTRAST TECHNIQUE: Contiguous axial images were obtained from the base of the skull through the vertex without intravenous  contrast. RADIATION DOSE REDUCTION: This exam was performed according to the departmental dose-optimization program which includes automated exposure control, adjustment of the mA and/or kV according to patient size and/or use of iterative reconstruction technique. COMPARISON:  09/18/2020 FINDINGS: Brain: No evidence of acute infarction, hemorrhage, cerebral edema, mass, mass effect, or midline shift. No hydrocephalus or extra-axial fluid collection. Cerebral volume is within normal limits for age. Vascular: No hyperdense vessel. Known left paraophthalmic ICA aneurysm is not well visualized on this noncontrast CT. Redemonstrated left ICA tortuosity. Skull: Mild hyperostosis frontalis negative for fracture or focal lesion. Sinuses/Orbits: No acute finding. Other: The mastoid air cells are well aerated. IMPRESSION: No acute intracranial process. No etiology is seen for the patient's headache Electronically Signed  By: Merilyn Baba M.D.   On: 03/14/2022 12:42   ? ?Procedures ?Procedures  ? ? ?Medications Ordered in ED ?Medications  ?diphenhydrAMINE (BENADRYL) capsule 50 mg (has no administration in time range)  ?  Or  ?diphenhydrAMINE (BENADRYL) injection 50 mg (has no administration in time range)  ?ketorolac (TORADOL) 15 MG/ML injection (  Not Given 03/14/22 1454)  ?methylPREDNISolone sodium succinate (SOLU-MEDROL) 40 mg/mL injection (  Not Given 03/14/22 1454)  ?metoCLOPramide (REGLAN) 5 MG/ML injection (  Not Given 03/14/22 1454)  ?methylPREDNISolone sodium succinate (SOLU-MEDROL) 40 mg/mL injection 40 mg (40 mg Intravenous Given 03/14/22 1406)  ?ketorolac (TORADOL) 15 MG/ML injection 15 mg (15 mg Intravenous Given 03/14/22 1405)  ?metoCLOPramide (REGLAN) injection 10 mg (10 mg Intravenous Given 03/14/22 1406)  ? ? ?ED Course/ Medical Decision Making/ A&P ?  ?                        ?Medical Decision Making ?Amount and/or Complexity of Data Reviewed ?Labs: ordered. ?Radiology: ordered. ? ?Risk ?Prescription drug  management. ? ? ?This patient presents to the ED with chief complaint(s) of headaches that are chronic, but were worse today than usual.  Associated with the headache she had some nausea, blurry vision and dizziness whic

## 2022-03-14 NOTE — ED Notes (Signed)
Pt reports some improvement in headache following medications ?

## 2022-03-14 NOTE — ED Provider Notes (Signed)
Assumed care from Dr. Rhunette Croft.  Patient's CTA of her brain shows a stable aneurysm without acute changes.  Findings were discussed with the patient and her family member.  She is stable for discharge at this time. ?  ?Gwyneth Sprout, MD ?03/14/22 1901 ? ?

## 2022-03-14 NOTE — Discharge Instructions (Addendum)
? ? ? ?  Please follow-up with your primary care doctor or neurologist in 1 week for evaluation of your symptoms.  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  Read the instructions provided on TMJ syndrome.  If they agree, then we recommend that they give you a referral to ENT doctors.   ?

## 2022-03-14 NOTE — ED Notes (Signed)
Ambulated to bathroom with steady gait

## 2022-03-14 NOTE — ED Triage Notes (Addendum)
C/o headache x 1 week, woke up at 0300 with worse headache, blurred vision, nausea, dizziness & hypertension. Has been having similar symptoms "for a while now, and has been seen by pcp". States hx of brain aneurysm.  ? ?Spanish interpreter [phone] used during triage ?

## 2022-04-05 ENCOUNTER — Ambulatory Visit
Admission: RE | Admit: 2022-04-05 | Discharge: 2022-04-05 | Disposition: A | Discharge status: Home / Self Care | Payer: Medicare Other | Source: Ambulatory Visit | Attending: Cardiovascular Disease | Admitting: Cardiovascular Disease

## 2022-04-05 ENCOUNTER — Other Ambulatory Visit: Payer: Self-pay | Admitting: Cardiovascular Disease

## 2022-04-05 DIAGNOSIS — R140 Abdominal distension (gaseous): Secondary | ICD-10-CM

## 2022-04-05 DIAGNOSIS — R1084 Generalized abdominal pain: Secondary | ICD-10-CM

## 2022-08-07 ENCOUNTER — Ambulatory Visit
Admission: RE | Admit: 2022-08-07 | Discharge: 2022-08-07 | Disposition: A | Discharge status: Home / Self Care | Payer: Medicare Other | Source: Ambulatory Visit | Attending: Cardiovascular Disease | Admitting: Cardiovascular Disease

## 2022-08-07 ENCOUNTER — Other Ambulatory Visit: Payer: Self-pay | Admitting: Cardiovascular Disease

## 2022-08-07 DIAGNOSIS — R079 Chest pain, unspecified: Secondary | ICD-10-CM

## 2022-08-07 DIAGNOSIS — R059 Cough, unspecified: Secondary | ICD-10-CM

## 2023-10-08 LAB — COLOGUARD: COLOGUARD: NEGATIVE

## 2024-01-05 ENCOUNTER — Emergency Department (HOSPITAL_BASED_OUTPATIENT_CLINIC_OR_DEPARTMENT_OTHER)

## 2024-01-05 ENCOUNTER — Other Ambulatory Visit: Payer: Self-pay

## 2024-01-05 ENCOUNTER — Encounter (HOSPITAL_BASED_OUTPATIENT_CLINIC_OR_DEPARTMENT_OTHER): Payer: Self-pay | Admitting: Emergency Medicine

## 2024-01-05 ENCOUNTER — Observation Stay (HOSPITAL_BASED_OUTPATIENT_CLINIC_OR_DEPARTMENT_OTHER)
Admission: EM | Admit: 2024-01-05 | Discharge: 2024-01-06 | Disposition: A | Discharge status: Home / Self Care | Attending: Family Medicine | Admitting: Family Medicine

## 2024-01-05 DIAGNOSIS — G8929 Other chronic pain: Secondary | ICD-10-CM | POA: Diagnosis present

## 2024-01-05 DIAGNOSIS — Z79899 Other long term (current) drug therapy: Secondary | ICD-10-CM | POA: Insufficient documentation

## 2024-01-05 DIAGNOSIS — Z7901 Long term (current) use of anticoagulants: Secondary | ICD-10-CM | POA: Diagnosis not present

## 2024-01-05 DIAGNOSIS — I10 Essential (primary) hypertension: Secondary | ICD-10-CM | POA: Diagnosis present

## 2024-01-05 DIAGNOSIS — M810 Age-related osteoporosis without current pathological fracture: Secondary | ICD-10-CM | POA: Diagnosis not present

## 2024-01-05 DIAGNOSIS — J45901 Unspecified asthma with (acute) exacerbation: Secondary | ICD-10-CM | POA: Diagnosis not present

## 2024-01-05 DIAGNOSIS — J9601 Acute respiratory failure with hypoxia: Secondary | ICD-10-CM | POA: Insufficient documentation

## 2024-01-05 DIAGNOSIS — J111 Influenza due to unidentified influenza virus with other respiratory manifestations: Secondary | ICD-10-CM | POA: Diagnosis present

## 2024-01-05 DIAGNOSIS — Z1152 Encounter for screening for COVID-19: Secondary | ICD-10-CM | POA: Insufficient documentation

## 2024-01-05 DIAGNOSIS — E785 Hyperlipidemia, unspecified: Secondary | ICD-10-CM | POA: Diagnosis present

## 2024-01-05 DIAGNOSIS — R109 Unspecified abdominal pain: Secondary | ICD-10-CM | POA: Diagnosis not present

## 2024-01-05 DIAGNOSIS — J101 Influenza due to other identified influenza virus with other respiratory manifestations: Secondary | ICD-10-CM | POA: Insufficient documentation

## 2024-01-05 DIAGNOSIS — R0602 Shortness of breath: Secondary | ICD-10-CM | POA: Diagnosis present

## 2024-01-05 LAB — RESP PANEL BY RT-PCR (RSV, FLU A&B, COVID)  RVPGX2
Influenza A by PCR: POSITIVE — AB
Influenza B by PCR: NEGATIVE
Resp Syncytial Virus by PCR: NEGATIVE
SARS Coronavirus 2 by RT PCR: NEGATIVE

## 2024-01-05 LAB — CBC WITH DIFFERENTIAL/PLATELET
Abs Immature Granulocytes: 0 10*3/uL (ref 0.00–0.07)
Basophils Absolute: 0 10*3/uL (ref 0.0–0.1)
Basophils Relative: 0 %
Eosinophils Absolute: 0 10*3/uL (ref 0.0–0.5)
Eosinophils Relative: 0 %
HCT: 38.6 % (ref 36.0–46.0)
Hemoglobin: 12.5 g/dL (ref 12.0–15.0)
Immature Granulocytes: 0 %
Lymphocytes Relative: 19 %
Lymphs Abs: 0.9 10*3/uL (ref 0.7–4.0)
MCH: 29.8 pg (ref 26.0–34.0)
MCHC: 32.4 g/dL (ref 30.0–36.0)
MCV: 91.9 fL (ref 80.0–100.0)
Monocytes Absolute: 0.5 10*3/uL (ref 0.1–1.0)
Monocytes Relative: 11 %
Neutro Abs: 3.2 10*3/uL (ref 1.7–7.7)
Neutrophils Relative %: 70 %
Platelets: 288 10*3/uL (ref 150–400)
RBC: 4.2 MIL/uL (ref 3.87–5.11)
RDW: 13.9 % (ref 11.5–15.5)
WBC: 4.6 10*3/uL (ref 4.0–10.5)
nRBC: 0 % (ref 0.0–0.2)

## 2024-01-05 LAB — COMPREHENSIVE METABOLIC PANEL
ALT: 13 U/L (ref 0–44)
AST: 18 U/L (ref 15–41)
Albumin: 4.6 g/dL (ref 3.5–5.0)
Alkaline Phosphatase: 69 U/L (ref 38–126)
Anion gap: 9 (ref 5–15)
BUN: 14 mg/dL (ref 8–23)
CO2: 26 mmol/L (ref 22–32)
Calcium: 9.1 mg/dL (ref 8.9–10.3)
Chloride: 102 mmol/L (ref 98–111)
Creatinine, Ser: 1.03 mg/dL — ABNORMAL HIGH (ref 0.44–1.00)
GFR, Estimated: 57 mL/min — ABNORMAL LOW (ref >60)
Glucose, Bld: 113 mg/dL — ABNORMAL HIGH (ref 70–99)
Potassium: 3.8 mmol/L (ref 3.5–5.1)
Sodium: 137 mmol/L (ref 135–145)
Total Bilirubin: 0.4 mg/dL (ref 0.0–1.2)
Total Protein: 7.3 g/dL (ref 6.5–8.1)

## 2024-01-05 MED ORDER — FLUTICASONE PROPIONATE 50 MCG/ACT NA SUSP
2.0000 | Freq: Every day | NASAL | Status: DC
  Administered 2024-01-05: 2 via NASAL
  Filled 2024-01-05: qty 16

## 2024-01-05 MED ORDER — POLYETHYLENE GLYCOL 3350 17 G PO PACK: 17.0000 g | PACK | Freq: Every day | ORAL | Status: DC | PRN

## 2024-01-05 MED ORDER — ISOSORBIDE MONONITRATE ER 30 MG PO TB24
30.0000 mg | ORAL_TABLET | Freq: Every day | ORAL | Status: DC
  Administered 2024-01-06: 30 mg via ORAL
  Filled 2024-01-05: qty 1

## 2024-01-05 MED ORDER — GUAIFENESIN-CODEINE 100-10 MG/5ML PO SOLN: 10.0000 mL | Freq: Four times a day (QID) | ORAL | Status: DC | PRN

## 2024-01-05 MED ORDER — ATORVASTATIN CALCIUM 40 MG PO TABS: 40.0000 mg | ORAL_TABLET | Freq: Every day | ORAL | Status: DC

## 2024-01-05 MED ORDER — IPRATROPIUM-ALBUTEROL 0.5-2.5 (3) MG/3ML IN SOLN
3.0000 mL | Freq: Four times a day (QID) | RESPIRATORY_TRACT | Status: DC
Start: 2024-01-05 — End: 2024-01-06
  Administered 2024-01-05 – 2024-01-06 (×3): 3 mL via RESPIRATORY_TRACT
  Filled 2024-01-05 (×2): qty 3

## 2024-01-05 MED ORDER — IPRATROPIUM-ALBUTEROL 0.5-2.5 (3) MG/3ML IN SOLN
RESPIRATORY_TRACT | Status: AC
  Filled 2024-01-05: qty 3

## 2024-01-05 MED ORDER — ASPIRIN 81 MG PO TBEC
81.0000 mg | DELAYED_RELEASE_TABLET | Freq: Every day | ORAL | Status: DC
  Administered 2024-01-06: 81 mg via ORAL
  Filled 2024-01-05: qty 1

## 2024-01-05 MED ORDER — SODIUM CHLORIDE 0.9% FLUSH
3.0000 mL | Freq: Two times a day (BID) | INTRAVENOUS | Status: DC
  Administered 2024-01-06: 3 mL via INTRAVENOUS

## 2024-01-05 MED ORDER — ROSUVASTATIN CALCIUM 10 MG PO TABS
20.0000 mg | ORAL_TABLET | Freq: Every day | ORAL | Status: DC
  Administered 2024-01-06: 20 mg via ORAL
  Filled 2024-01-05: qty 2

## 2024-01-05 MED ORDER — LORATADINE 10 MG PO TABS: 10.0000 mg | ORAL_TABLET | Freq: Every day | ORAL | Status: DC

## 2024-01-05 MED ORDER — OSELTAMIVIR PHOSPHATE 30 MG PO CAPS
30.0000 mg | ORAL_CAPSULE | Freq: Two times a day (BID) | ORAL | Status: DC
  Administered 2024-01-05 – 2024-01-06 (×2): 30 mg via ORAL
  Filled 2024-01-05 (×2): qty 1

## 2024-01-05 MED ORDER — IPRATROPIUM BROMIDE 0.02 % IN SOLN: 0.5000 mg | Freq: Four times a day (QID) | RESPIRATORY_TRACT | Status: DC

## 2024-01-05 MED ORDER — ESCITALOPRAM OXALATE 10 MG PO TABS
5.0000 mg | ORAL_TABLET | Freq: Every day | ORAL | Status: DC
  Administered 2024-01-06: 5 mg via ORAL
  Filled 2024-01-05: qty 1

## 2024-01-05 MED ORDER — AMLODIPINE BESYLATE 10 MG PO TABS
10.0000 mg | ORAL_TABLET | Freq: Every day | ORAL | Status: DC
  Administered 2024-01-06: 10 mg via ORAL
  Filled 2024-01-05: qty 1

## 2024-01-05 MED ORDER — FAMOTIDINE 20 MG PO TABS: 40.0000 mg | ORAL_TABLET | Freq: Every day | ORAL | Status: DC | PRN

## 2024-01-05 MED ORDER — IPRATROPIUM-ALBUTEROL 0.5-2.5 (3) MG/3ML IN SOLN
3.0000 mL | Freq: Once | RESPIRATORY_TRACT | Status: AC
  Administered 2024-01-05: 3 mL via RESPIRATORY_TRACT
  Filled 2024-01-05: qty 3

## 2024-01-05 MED ORDER — ENOXAPARIN SODIUM 40 MG/0.4ML IJ SOSY
40.0000 mg | PREFILLED_SYRINGE | Freq: Every day | INTRAMUSCULAR | Status: DC
  Administered 2024-01-05: 40 mg via SUBCUTANEOUS
  Filled 2024-01-05: qty 0.4

## 2024-01-05 MED ORDER — DICYCLOMINE HCL 20 MG PO TABS: 20.0000 mg | ORAL_TABLET | Freq: Three times a day (TID) | ORAL | Status: DC | PRN

## 2024-01-05 MED ORDER — LISINOPRIL 10 MG PO TABS: 10.0000 mg | ORAL_TABLET | Freq: Every day | ORAL | Status: DC

## 2024-01-05 MED ORDER — METHYLPREDNISOLONE SODIUM SUCC 40 MG IJ SOLR
40.0000 mg | Freq: Two times a day (BID) | INTRAMUSCULAR | Status: DC
  Administered 2024-01-06: 40 mg via INTRAVENOUS
  Filled 2024-01-05: qty 1

## 2024-01-05 MED ORDER — ZOLPIDEM TARTRATE 5 MG PO TABS
5.0000 mg | ORAL_TABLET | Freq: Every evening | ORAL | Status: DC | PRN
Start: 2024-01-05 — End: 2024-01-07
  Administered 2024-01-06: 5 mg via ORAL
  Filled 2024-01-05: qty 1

## 2024-01-05 MED ORDER — ALBUTEROL SULFATE (2.5 MG/3ML) 0.083% IN NEBU: 2.5000 mg | INHALATION_SOLUTION | Freq: Four times a day (QID) | RESPIRATORY_TRACT | Status: DC

## 2024-01-05 MED ORDER — MOMETASONE FURO-FORMOTEROL FUM 200-5 MCG/ACT IN AERO
2.0000 | INHALATION_SPRAY | Freq: Two times a day (BID) | RESPIRATORY_TRACT | Status: DC
  Administered 2024-01-06: 2 via RESPIRATORY_TRACT
  Filled 2024-01-05: qty 8.8

## 2024-01-05 MED ORDER — METHYLPREDNISOLONE SODIUM SUCC 125 MG IJ SOLR
125.0000 mg | Freq: Once | INTRAMUSCULAR | Status: AC
  Administered 2024-01-05: 125 mg via INTRAVENOUS
  Filled 2024-01-05: qty 2

## 2024-01-05 NOTE — ED Notes (Signed)
 RT ambulated the Pt on RA. Her SATS 90%-94%.Pt was 94% when we initially started walking. RT raised the Pt's the head of the bed and kept her oxygen off and she is 90% while laying in bed.

## 2024-01-05 NOTE — ED Provider Notes (Signed)
 [place] EMERGENCY DEPARTMENT AT [facility] Provider Note   CSN: [id number] Arrival date & time: 01/05/24  4098     History  Chief Complaint  Patient presents with   Cough    Natalie Willis is a 78 y.o. female.  Patient here with ongoing cough wheezing congestion for the last 5 or 6 days.  She has been on 20 mg of prednisone daily as well as Augmentin for this.  Has a history of asthma.  History of hypertension high cholesterol.  Still having some bodyaches chills at night.  Feel with some wheezing and chronic cough.  Nothing makes it worse or better.  Denies any weakness numbness tingling.  Denies any nausea vomiting diarrhea.  The history is provided by the patient.       Home Medications Prior to Admission medications   Medication Sig Start Date End Date Taking? Authorizing Provider  albuterol (PROVENTIL HFA;VENTOLIN HFA) 108 (90 BASE) MCG/ACT inhaler Inhale 2 puffs into the lungs every 6 (six) hours as needed for wheezing.    [provider]  alendronate (FOSAMAX) 70 MG tablet Take 1 tablet by mouth every 7 (seven) days. Thursdays 03/14/21   [provider]  amLODipine (NORVASC) 10 MG tablet Take 1 tablet by mouth daily. 03/29/21   [provider]  amLODipine (NORVASC) 5 MG tablet Take 5 mg by mouth daily. Patient not taking: Reported on 07/31/2021    [provider]  aspirin 81 MG EC tablet Take 81 mg by mouth daily.    [provider]  atorvastatin (LIPITOR) 40 MG tablet Take 40 mg by mouth daily.    [provider]  budesonide-formoterol (SYMBICORT) 160-4.5 MCG/ACT inhaler Inhale 2 puffs into the lungs 2 (two) times daily.    [provider]  cetirizine (ZYRTEC) 10 MG tablet Take 10 mg by mouth daily.    [provider]  dicyclomine (BENTYL) 20 MG tablet Take 20 mg by mouth 3 (three) times daily as needed. 05/04/21   [provider]  escitalopram (LEXAPRO) 5 MG tablet Take 5 mg by  mouth daily. 05/27/21   [provider]  famotidine (PEPCID) 10 MG tablet Take 10 mg by mouth daily.    [provider]  fluticasone (FLONASE) 50 MCG/ACT nasal spray Place 2 sprays into both nostrils at bedtime.    [provider]  gabapentin (NEURONTIN) 100 MG capsule Take 1 capsule (100 mg total) by mouth 3 (three) times daily. Patient not taking: No sig reported 09/18/20   Sabas Sous, MD  guaiFENesin-codeine 100-10 MG/5ML syrup Take 10 mLs by mouth every 6 (six) hours as needed for cough. 07/31/21   Geoffery Lyons, MD  isosorbide mononitrate (IMDUR) 30 MG 24 hr tablet Take 30 mg by mouth daily. 05/25/21   [provider]  lisinopril (ZESTRIL) 10 MG tablet Take 10 mg by mouth daily.    [provider]  metoCLOPramide (REGLAN) 10 MG tablet Take 1 tablet (10 mg total) by mouth every 6 (six) hours as needed for nausea (nausea/headache). Patient not taking: No sig reported 12/23/18   Charlynne Pander, MD  zolpidem (AMBIEN) 10 MG tablet Take 10 mg by mouth at bedtime.    [provider]      Allergies    Contrast media [iodinated contrast media], Corn-containing products, Milk-related compounds, Dilaudid [hydromorphone hcl], Hydromorphone, and Morphine    Review of Systems   Review of Systems  Physical Exam Updated Vital Signs BP 117/68 (BP Location:  Left Arm)   Pulse (!) 101   Temp 99.1 F (37.3 C) (Oral)   Resp 18   Ht 5' (1.524 m)   Wt 58.5 kg   SpO2 99%   BMI 25.19 kg/m  Physical Exam Vitals and nursing note reviewed.  Constitutional:      General: She is not in acute distress.    Appearance: She is well-developed. She is not ill-appearing.  HENT:     Head: Normocephalic and atraumatic.     Nose: Nose normal.     Mouth/Throat:     Mouth: Mucous membranes are moist.  Eyes:     Extraocular Movements: Extraocular movements intact.     Conjunctiva/sclera: Conjunctivae normal.     Pupils: Pupils are equal, round, and  reactive to light.  Cardiovascular:     Rate and Rhythm: Normal rate and regular rhythm.     Pulses: Normal pulses.     Heart sounds: No murmur heard. Pulmonary:     Effort: No respiratory distress.     Breath sounds: Wheezing present.     Comments: Increased work of breathing Abdominal:     Palpations: Abdomen is soft.     Tenderness: There is no abdominal tenderness.  Musculoskeletal:        General: No swelling.     Cervical back: Normal range of motion and neck supple.  Skin:    General: Skin is warm and dry.     Capillary Refill: Capillary refill takes less than 2 seconds.  Neurological:     General: No focal deficit present.     Mental Status: She is alert.  Psychiatric:        Mood and Affect: Mood normal.     ED Results / Procedures / Treatments   Labs (all labs ordered are listed, but only abnormal results are displayed) Labs Reviewed  RESP PANEL BY RT-PCR (RSV, FLU A&B, COVID)  RVPGX2 - Abnormal; Notable for the following components:      Result Value   Influenza A by PCR POSITIVE (*)    All other components within normal limits  CBC WITH DIFFERENTIAL/PLATELET  COMPREHENSIVE METABOLIC PANEL    EKG None  Radiology DG Chest Portable 1 View Result Date: 01/05/2024 CLINICAL DATA:  Cough and wheezing. EXAM: PORTABLE CHEST 1 VIEW COMPARISON:  08/07/2022 FINDINGS: Normal heart size. Aortic atherosclerosis. Bronchial wall thickening identified bilaterally. No pleural effusion, interstitial edema or airspace disease. Osseous structures appear intact. IMPRESSION: Bronchial wall thickening compatible with bronchitis or reactive airways disease. Electronically Signed   By: Signa Kell M.D.   On: 01/05/2024 10:28    Procedures Procedures    Medications Ordered in ED Medications  ipratropium-albuterol (DUONEB) 0.5-2.5 (3) MG/3ML nebulizer solution 3 mL (3 mLs Nebulization Given 01/05/24 1024)  methylPREDNISolone sodium succinate (SOLU-MEDROL) 125 mg/2 mL injection 125  mg (125 mg Intravenous Given 01/05/24 1028)    ED Course/ Medical Decision Making/ A&P                                 Medical Decision Making Amount and/or Complexity of Data Reviewed Labs: ordered. Radiology: ordered.  Risk Prescription drug management. Decision regarding hospitalization.   Sarajane Marek is here with ongoing cough congestion body aches chills.  History of asthma hypertension.  She has been on 20 mg of prednisone for the last couple days as well as Augmentin but not really having good improvement.  Feels  short of breath especially ambulation.  She denies any chest pain.  No nausea vomit diarrhea.  She arrives here with unremarkable vitals.  No fever.  Differential diagnosis likely ongoing viral/infectious process.  Seems like mostly asthma symptoms at this time.  Could be pneumonia as well.  Will check basic labs chest x-ray viral panel give breathing treatment IV Solu-Medrol reevaluate.  Chest x-ray per radiology report consistent with more bronchitis reactive airway process.  No focal pneumonia.  No white count or anemia per my review and interpretation of labs.  She still pretty tachypneic with increased work of breathing oxygen intermittently 89/90% at rest.  Flu test is positive.  Overall I do think that this is likely viral pneumonia/bronchitis/respiratory failure asthma exacerbation.  She has not done well despite antibiotics and steroids outpatient here ready the last few days and I think it is worth an observation stay to increase steroids and likely consider some IV antibiotics.  I do not think she has sepsis.  Will talk with hospitalist about blood cultures and IV antibiotics.  She has been placed on 2 L of oxygen.  Will continue some breathing treatments.  Will admit to hospitalist for further care.  This chart was dictated using voice recognition software.  Despite best efforts to proofread,  errors can occur which can change the documentation meaning.          Final Clinical Impression(s) / ED Diagnoses Final diagnoses:  Mild asthma with exacerbation, unspecified whether persistent  Acute respiratory failure with hypoxia (HCC)  Influenza A    Rx / DC Orders ED Discharge Orders     None         Virgina Norfolk, DO 01/05/24 1041

## 2024-01-05 NOTE — ED Notes (Signed)
 Called [facility] for transport, pt bed assignment is ready

## 2024-01-05 NOTE — H&P (Signed)
 History and Physical:    Natalie Willis   WUJ:[id number] DOB: [birth year] DOA: 01/05/2024  Referring MD/provider: Virgina Norfolk, DO PCP: Andreas Blower., MD   Patient coming from: Home  Chief Complaint: Shortness of breath  History of Present Illness:   Natalie Willis is an 78 y.o. female with a past medical history of asthma, hypertension, and hyperlipidemia.  She developed upper respiratory symptoms 5-6 days ago and endorsed earache, sore throat, headache, cough, and increasing shortness of breath over the past few days.  She has also been wheezing.  She failed outpatient therapy with 20 mg of prednisone and Augmentin.  On exam, she is able to speak in full sentences and appears comfortable.  ED Course:  Natalie Willis was seen in the ED and had a diagnostic evaluation showing she was influenza A positive.  Chest x-ray was negative for focal pneumonia, but did show bronchial wall thickening compatible with bronchitis or reactive airways disease.  Labs were relatively stable with no leukocytosis or electrolyte disturbances. GFR was mildly reduced at 57 versus a baseline of greater than 60.  She was given 125 mg of Solu-Medrol and a DuoNeb and referred for admission.  ROS:   Review of Systems  Constitutional:  Positive for chills and fever.  HENT:  Positive for ear pain.   Eyes: Negative.   Respiratory:  Positive for cough, shortness of breath and wheezing.   Cardiovascular:  Positive for chest pain.  Gastrointestinal:  Positive for nausea. Negative for vomiting.  Genitourinary: Negative.   Musculoskeletal:  Positive for myalgias.  Neurological: Negative.   Endo/Heme/Allergies: Negative.   Psychiatric/Behavioral: Negative.      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--  stable per last MRI  06-24-2013   GERD (gastroesophageal reflux disease)    HA (headache)    Hematuria     Hyperlipidemia    Hypertension    Internal hemorrhoid    Right ureteral stone     Past Surgical History:   Past Surgical History:  Procedure Laterality Date   ABDOMINAL HYSTERECTOMY     APPENDECTOMY     CARDIAC CATHETERIZATION  10/ 2009   DR  [facility]   NORMAL CORONARIES AND  LVSF   COLONOSCOPY  02-15-2012   CYSTOSCOPY WITH RETROGRADE PYELOGRAM, URETEROSCOPY AND STENT PLACEMENT Right 04/14/2014   Procedure: CYSTOSCOPY WITH RETROGRADE PYELOGRAM, URETEROSCOPY AND STENT PLACEMENT;  Surgeon: Magdalene Molly, MD;  Location: [facility];  Service: Urology;  Laterality: Right;   TRANSTHORACIC ECHOCARDIOGRAM  10-19-2008   NORMAL STUDY/  EF 55%    Social History:   Social History   Socioeconomic History   Marital status: Legally Separated    Spouse name: Not on file   Number of children: Not on file   Years of education: Not on file   Highest education level: Not on file  Occupational History   Not on file  Tobacco Use   Smoking status: Never   Smokeless tobacco: Never  Substance and Sexual Activity   Alcohol use: No   Drug use: No   Sexual activity: Not on file  Other Topics Concern   Not on file  Social History Narrative   Not on file   Social Drivers of Health   Financial Resource Strain: Not on file  Food Insecurity: No Food Insecurity (01/05/2024)   Hunger Vital  Sign    Worried About Programme researcher, broadcasting/film/video in the Last Year: Never true    Ran Out of Food in the Last Year: Never true  Transportation Needs: No Transportation Needs (01/05/2024)   PRAPARE - Administrator, Civil Service (Medical): No    Lack of Transportation (Non-Medical): No  Physical Activity: Not on file  Stress: Not on file  Social Connections: Moderately Isolated (01/05/2024)   Social Connection and Isolation Panel [NHANES]    Frequency of Communication with Friends and Family: More than three times a week    Frequency of Social Gatherings with Friends and Family: More than  three times a week    Attends Religious Services: More than 4 times per year    Active Member of [employer] or Organizations: No    Attends Banker Meetings: Never    Marital Status: Separated  Intimate Partner Violence: Not At Risk (01/05/2024)   Humiliation, Afraid, Rape, and Kick questionnaire    Fear of Current or Ex-Partner: No    Emotionally Abused: No    Physically Abused: No    Sexually Abused: No    Allergies   Contrast media [iodinated contrast media], Corn-containing products, Milk-related compounds, Dilaudid [hydromorphone hcl], Hydromorphone, and Morphine  Family history:   Family History  Problem Relation Age of Onset   Heart attack Father     Current Medications:   Prior to Admission medications   Medication Sig Start Date End Date Taking? Authorizing Provider  albuterol (PROVENTIL HFA;VENTOLIN HFA) 108 (90 BASE) MCG/ACT inhaler Inhale 2 puffs into the lungs every 6 (six) hours as needed for wheezing.    [provider]  alendronate (FOSAMAX) 70 MG tablet Take 1 tablet by mouth every 7 (seven) days. Thursdays 03/14/21   [provider]  amLODipine (NORVASC) 10 MG tablet Take 1 tablet by mouth daily. 03/29/21   [provider]  amLODipine (NORVASC) 5 MG tablet Take 5 mg by mouth daily. Patient not taking: Reported on 07/31/2021    [provider]  aspirin 81 MG EC tablet Take 81 mg by mouth daily.    [provider]  atorvastatin (LIPITOR) 40 MG tablet Take 40 mg by mouth daily.    [provider]  budesonide-formoterol (SYMBICORT) 160-4.5 MCG/ACT inhaler Inhale 2 puffs into the lungs 2 (two) times daily.    [provider]  cetirizine (ZYRTEC) 10 MG tablet Take 10 mg by mouth daily.    [provider]  dicyclomine (BENTYL) 20 MG tablet Take 20 mg by mouth 3 (three) times daily as needed. 05/04/21   [provider]  escitalopram (LEXAPRO) 5 MG tablet Take 5 mg by mouth daily. 05/27/21    [provider]  famotidine (PEPCID) 10 MG tablet Take 10 mg by mouth daily.    [provider]  fluticasone (FLONASE) 50 MCG/ACT nasal spray Place 2 sprays into both nostrils at bedtime.    [provider]  gabapentin (NEURONTIN) 100 MG capsule Take 1 capsule (100 mg total) by mouth 3 (three) times daily. Patient not taking: No sig reported 09/18/20   Sabas Sous, MD  guaiFENesin-codeine 100-10 MG/5ML syrup Take 10 mLs by mouth every 6 (six) hours as needed for cough. 07/31/21   Geoffery Lyons, MD  isosorbide mononitrate (IMDUR) 30 MG 24 hr tablet Take 30 mg by mouth daily. 05/25/21   [provider]  lisinopril (ZESTRIL) 10 MG tablet Take 10 mg by mouth daily.  [provider]  metoCLOPramide (REGLAN) 10 MG tablet Take 1 tablet (10 mg total) by mouth every 6 (six) hours as needed for nausea (nausea/headache). Patient not taking: No sig reported 12/23/18   Charlynne Pander, MD  zolpidem (AMBIEN) 10 MG tablet Take 10 mg by mouth at bedtime.    [provider]    Physical Exam:   Vitals:   01/05/24 1400 01/05/24 1614 01/05/24 1640 01/05/24 1736  BP: (!) 116/59  117/70 (!) 115/56  Pulse: 91  84 81  Resp: (!) 26  (!) 26 20  Temp:  99.2 F (37.3 C)  98.5 F (36.9 C)  TempSrc:  Oral  Oral  SpO2: 95%  97% 95%  Weight:    57.3 kg  Height:    5' 1.5" (1.562 m)     Physical Exam: Blood pressure (!) 115/56, pulse 81, temperature 98.5 F (36.9 C), temperature source Oral, resp. rate 20, height 5' 1.5" (1.562 m), weight 57.3 kg, SpO2 95%. Gen: No acute distress.  Comfortable and speaking in full sentences. Head: Normocephalic, atraumatic. Eyes: Pupils equal, round and reactive to light. Extraocular movements intact.  Sclerae nonicteric. No lid lag. Mouth: Oropharynx intact.  Membranes moist. Chest: Good air movement but some expiratory wheezing.  Speaking in full sentences with no evidence of increased work of breathing. CV: Heart  sounds are regular with an S1, S2. No murmurs, rubs, clicks, or gallops.  Abdomen: Soft, nontender, nondistended with normal active bowel sounds. No hepatosplenomegaly or palpable masses. Extremities: Extremities are without clubbing, or cyanosis. No edema. Pedal pulses 2+.  Skin: Warm and dry. No rashes, lesions or wounds.   Data Review:    Labs: Basic Metabolic Panel: Recent Labs  Lab 01/05/24 1024  NA 137  K 3.8  CL 102  CO2 26  GLUCOSE 113*  BUN 14  CREATININE 1.03*  CALCIUM 9.1   Liver Function Tests: Recent Labs  Lab 01/05/24 1024  AST 18  ALT 13  ALKPHOS 69  BILITOT 0.4  PROT 7.3  ALBUMIN 4.6   CBC: Recent Labs  Lab 01/05/24 1024  WBC 4.6  NEUTROABS 3.2  HGB 12.5  HCT 38.6  MCV 91.9  PLT 288     Radiographic Studies: DG Chest Portable 1 View Result Date: 01/05/2024 CLINICAL DATA:  Cough and wheezing. EXAM: PORTABLE CHEST 1 VIEW COMPARISON:  08/07/2022 FINDINGS: Normal heart size. Aortic atherosclerosis. Bronchial wall thickening identified bilaterally. No pleural effusion, interstitial edema or airspace disease. Osseous structures appear intact. IMPRESSION: Bronchial wall thickening compatible with bronchitis or reactive airways disease. Electronically Signed   By: Signa Kell M.D.   On: 01/05/2024 10:28    EKG: Independently reviewed.  Normal sinus rhythm with no significant ST or T wave changes.   Assessment/Plan:   Principal Problem:   Asthma exacerbation due to influenza with respiratory manifestation Continue Symbicort and add Solu-Medrol 40 mg every 12 hours.  The patient is already markedly improved after one 125 mg dose given in the ED.  Will suppress her cough with guaifenesin/codeine as needed.  Although likely out of the window of time when Tamiflu would be helpful, will go ahead and start her on Tamiflu.  Will provide DuoNebs every 6 hours.  Her pulse oximetry is stable and she has normal work of breathing at the present time so I  anticipate she will be stable for discharge in the morning.  Active Problems:   Chronic abdominal pain Bentyl ordered as needed.  Essential hypertension Continue home medications.  Blood pressure is stable.    Hyperlipidemia Continue Lipitor.    Osteoporosis Takes Fosamax at home.  Body mass index is 23.48 kg/m.  Other information:   DVT prophylaxis: Lovenox ordered. Code Status: Full code. Family Communication: No family at the bedside. Disposition Plan: Likely home 01/06/2024. Consults called: None. Admission status: Observation.   The medical decision making is of moderate complexity, therefore this is a level 2 visit.  Trula Ore Dalayah Deahl [facility] Pager [phone]   How to contact the [facility] Attending or Consulting provider 7A - 7P or covering provider during after hours 7P -7A, for this patient?   Check the care team in [facility] and look for a) attending/consulting [facility] provider listed and b) the [facility] team listed Log into [email or web address] and use [employer]'s universal password to access. If you do not have the password, please contact the hospital operator. Locate the [facility] provider you are looking for under [facility] and page to a number that you can be directly reached. If you still have difficulty reaching the provider, please page the [facility] (Director on Call) for the Hospitalists listed on amion for assistance.  01/05/2024, 7:14 PM

## 2024-01-05 NOTE — ED Notes (Signed)
 Pt ambulated to bathroom on RA, standby assist. Tolerated well. No signs of dizziness or respiratory distress. Jillyn Hidden, RN

## 2024-01-05 NOTE — ED Triage Notes (Signed)
 C?o cough, wheezing, congestion, and headache x 1 week. Seen at PCP dx w/ asthma and states she was given antibiotics.

## 2024-01-06 ENCOUNTER — Other Ambulatory Visit: Payer: Self-pay

## 2024-01-06 ENCOUNTER — Other Ambulatory Visit (HOSPITAL_COMMUNITY): Payer: Self-pay

## 2024-01-06 DIAGNOSIS — J45901 Unspecified asthma with (acute) exacerbation: Secondary | ICD-10-CM | POA: Diagnosis not present

## 2024-01-06 MED ORDER — OSELTAMIVIR PHOSPHATE 30 MG PO CAPS
30.0000 mg | ORAL_CAPSULE | Freq: Two times a day (BID) | ORAL | 0 refills | Status: AC
  Filled 2024-01-06 (×2): qty 8, 4d supply, fill #0

## 2024-01-06 MED ORDER — PREDNISONE 20 MG PO TABS
40.0000 mg | ORAL_TABLET | Freq: Every day | ORAL | 0 refills | Status: AC
  Filled 2024-01-06: qty 6, 3d supply, fill #0

## 2024-01-06 MED ORDER — GUAIFENESIN-CODEINE 100-10 MG/5ML PO SOLN
10.0000 mL | Freq: Four times a day (QID) | ORAL | 0 refills | Status: AC | PRN
  Filled 2024-01-06: qty 120, 3d supply, fill #0

## 2024-01-06 NOTE — Progress Notes (Signed)
 AVS reviewed w/ pt & spouse - both verbalized an understanding. AVS was also reviewed w/ pt's daughter by primary nurse. No Other questions at this time. PIV removed as noted. Pt dressed for d/c to home. Pt to lobby via w/c

## 2024-01-06 NOTE — Progress Notes (Addendum)
 TOC meds delivered in a secure bag to pt in room by this RN. Pt verbalized an understanding that Robitussin DM would need to be purchased OTC

## 2024-01-06 NOTE — Discharge Summary (Signed)
 Physician Discharge Summary   Patient: Natalie Willis MRN: [id number] DOB: 22-Mar-1946  Admit date:     01/05/2024  Discharge date: 01/06/24  Discharge Physician: Alberteen Sam   PCP: Andreas Blower., MD     Recommendations at discharge:  Follow up with Dr. Luiz Iron in 1 week for asthma exacerbation due to Flu     Discharge Diagnoses: Principal Problem:   Asthma exacerbation Active Problems:   Influenza A   Essential hypertension   Hyperlipidemia   Osteoporosis      Hospital Course: Natalie Willis is a 78 y.o. F with chronic asthma, HTN, who presented with URI symptoms.  In the ER, she was short of breath and so the hospitalist service were asked to observe overnight.  Asthma exacerbation Influenza A The patient was observed overnight.  She had no documented hypoxia.  In the morning, she was able to ambulate on room air with maintaining saturations above 90%.  She was discharged with prednisone burst, Tamiflu, recommendation for PCP follow-up.           The [facility] Controlled Substances Registry was reviewed for this patient prior to discharge.  Consultants: None Procedures performed: Chest x-ray Disposition: Home Diet recommendation:  Discharge Diet Orders (From admission, onward)     Start     Ordered   01/06/24 0000  Diet - low sodium heart healthy        01/06/24 0941             DISCHARGE MEDICATION: Allergies as of 01/06/2024       Reactions   Penicillins Anaphylaxis   Acetaminophen Nausea And Vomiting   Other Reaction(s): GI Intolerance   Contrast Media [iodinated Contrast Media] Other (See Comments)   unknown   Oxycodone Hcl Nausea And Vomiting   Other Reaction(s): GI Intolerance   Dilaudid [hydromorphone Hcl] Nausea And Vomiting, Other (See Comments)   Not an allergy, but pt appears sensitive to IV narcotics.    Hydromorphone Nausea And Vomiting, Other (See Comments)   Not an allergy, but pt appears sensitive to IV  narcotics.    Morphine Nausea And Vomiting   Other reaction(s): Vomiting   Oxycodone-acetaminophen    Other Reaction(s): GI Intolerance        Medication List     PAUSE taking these medications    valsartan 320 MG tablet Wait to take this until: January 13, 2024 Commonly known as: DIOVAN Take 320 mg by mouth daily.   zolpidem 5 MG tablet Wait to take this until your doctor or other care provider tells you to start again. Commonly known as: AMBIEN Take 5 mg by mouth at bedtime as needed for sleep.       STOP taking these medications    amoxicillin-clavulanate 875-125 MG tablet Commonly known as: AUGMENTIN   gabapentin 100 MG capsule Commonly known as: Neurontin   metoCLOPramide 10 MG tablet Commonly known as: Reglan       TAKE these medications    albuterol 108 (90 Base) MCG/ACT inhaler Commonly known as: VENTOLIN HFA Inhale 2 puffs into the lungs every 6 (six) hours as needed for wheezing.   amLODipine 10 MG tablet Commonly known as: NORVASC Take 1 tablet by mouth daily.   aspirin EC 81 MG tablet Take 81 mg by mouth once a week. On wednesdays   budesonide-formoterol 160-4.5 MCG/ACT inhaler Commonly known as: SYMBICORT Inhale 2 puffs into the lungs 2 (two) times daily.   famotidine 40 MG tablet Commonly known  as: PEPCID Take 40 mg by mouth daily as needed for heartburn or indigestion.   fluticasone 50 MCG/ACT nasal spray Commonly known as: FLONASE Place 2 sprays into both nostrils at bedtime.   guaiFENesin-codeine 100-10 MG/5ML syrup Take 10 mLs by mouth every 6 (six) hours as needed. What changed: reasons to take this   isosorbide mononitrate 30 MG 24 hr tablet Commonly known as: IMDUR Take 30 mg by mouth daily.   montelukast 10 MG tablet Commonly known as: SINGULAIR Take 10 mg by mouth daily.   oseltamivir 30 MG capsule Commonly known as: TAMIFLU Take 1 capsule (30 mg total) by mouth 2 (two) times daily.   predniSONE 20 MG  tablet Commonly known as: DELTASONE Take 2 tablets (40 mg total) by mouth daily with breakfast for 3 days. What changed:  how much to take when to take this   rosuvastatin 20 MG tablet Commonly known as: CRESTOR Take 20 mg by mouth daily.        Follow-up Information     Andreas Blower., MD. Schedule an appointment as soon as possible for a visit in 1 week(s).   Specialty: Internal Medicine Contact information: [address] [place] [address] [phone]                 Discharge Instructions     Diet - low sodium heart healthy   Complete by: As directed    Discharge instructions   Complete by: As directed    **IMPORTANT DISCHARGE INSTRUCTIONS**   From Dr. Maryfrances Bunnell: You were admitted for the flu (influenza)  This made your asthma flare  Thankfully, here, your chest x-ray was clear and you did not need oxygen.  To treat the flu, use oseltamivir/Tamiflu an anti-virus medicine YOu likely will also need cough syrup  Take Tamiflu 30 mg twice daily for 4 more days starting tonight Use the guaifenesin-codeine cough syrup up to twice daily Do not use codeine with Zolpidem and dispose of excess Do not drive with codeine, it is a narcotic cough syrup  If you do not wish to use a strong cough syrup like codeine, use over the counter guaifenesin-dextromethorphan (Robitussin DM)   For the asthma flare:  Take prednisone 40 mg (two tabs) once daily for 3 more days starting tomrrow morning Continue your home inhalers Take it easy You will likely need to use your albuterol three times daily for the next week, then reduce to as needed use  Go see Dr. Luiz Iron in 1 week no later   Increase activity slowly   Complete by: As directed        Discharge Exam: Filed Weights   01/05/24 0945 01/05/24 1736  Weight: 58.5 kg 57.3 kg    General: Pt is alert, awake, not in acute distress Cardiovascular: RRR, nl S1-S2, no murmurs appreciated.   No LE  edema.   Respiratory: Normal respiratory rate and rhythm.  Diminished but no rales or wheezing appreciated Abdominal: Abdomen soft and non-tender.  No distension or HSM.   Neuro/Psych: Strength symmetric in upper and lower extremities.  Judgment and insight appear normal.   Condition at discharge: good  The results of significant diagnostics from this hospitalization (including imaging, microbiology, ancillary and laboratory) are listed below for reference.   Imaging Studies: DG Chest Portable 1 View Result Date: 01/05/2024 CLINICAL DATA:  Cough and wheezing. EXAM: PORTABLE CHEST 1 VIEW COMPARISON:  08/07/2022 FINDINGS: Normal heart size. Aortic atherosclerosis. Bronchial wall thickening identified bilaterally.  No pleural effusion, interstitial edema or airspace disease. Osseous structures appear intact. IMPRESSION: Bronchial wall thickening compatible with bronchitis or reactive airways disease. Electronically Signed   By: Signa Kell M.D.   On: 01/05/2024 10:28    Microbiology: Results for orders placed or performed during the hospital encounter of 01/05/24  Resp panel by RT-PCR (RSV, Flu A&B, Covid) Anterior Nasal Swab     Status: Abnormal   Collection Time: 01/05/24  9:47 AM   Specimen: Anterior Nasal Swab  Result Value Ref Range Status   SARS Coronavirus 2 by RT PCR NEGATIVE NEGATIVE Final    Comment: (NOTE) SARS-CoV-2 target nucleic acids are NOT DETECTED.  The SARS-CoV-2 RNA is generally detectable in upper respiratory specimens during the acute phase of infection. The lowest concentration of SARS-CoV-2 viral copies this assay can detect is 138 copies/mL. A negative result does not preclude SARS-Cov-2 infection and should not be used as the sole basis for treatment or other patient management decisions. A negative result may occur with  improper specimen collection/handling, submission of specimen other than nasopharyngeal swab, presence of viral mutation(s) within the areas  targeted by this assay, and inadequate number of viral copies(<138 copies/mL). A negative result must be combined with clinical observations, patient history, and epidemiological information. The expected result is Negative.  Fact Sheet for Patients:  BloggerCourse.com  Fact Sheet for Healthcare Providers:  SeriousBroker.it  This test is no t yet approved or cleared by the [place] FDA and  has been authorized for detection and/or diagnosis of SARS-CoV-2 by FDA under an Emergency Use Authorization (EUA). This EUA will remain  in effect (meaning this test can be used) for the duration of the COVID-19 declaration under Section 564(b)(1) of the Act, 21 U.S.C.section 360bbb-3(b)(1), unless the authorization is terminated  or revoked sooner.       Influenza A by PCR POSITIVE (A) NEGATIVE Final   Influenza B by PCR NEGATIVE NEGATIVE Final    Comment: (NOTE) The Xpert Xpress SARS-CoV-2/FLU/RSV plus assay is intended as an aid in the diagnosis of influenza from Nasopharyngeal swab specimens and should not be used as a sole basis for treatment. Nasal washings and aspirates are unacceptable for Xpert Xpress SARS-CoV-2/FLU/RSV testing.  Fact Sheet for Patients: BloggerCourse.com  Fact Sheet for Healthcare Providers: SeriousBroker.it  This test is not yet approved or cleared by the [place] FDA and has been authorized for detection and/or diagnosis of SARS-CoV-2 by FDA under an Emergency Use Authorization (EUA). This EUA will remain in effect (meaning this test can be used) for the duration of the COVID-19 declaration under Section 564(b)(1) of the Act, 21 U.S.C. section 360bbb-3(b)(1), unless the authorization is terminated or revoked.     Resp Syncytial Virus by PCR NEGATIVE NEGATIVE Final    Comment: (NOTE) Fact Sheet for  Patients: BloggerCourse.com  Fact Sheet for Healthcare Providers: SeriousBroker.it  This test is not yet approved or cleared by the [place] FDA and has been authorized for detection and/or diagnosis of SARS-CoV-2 by FDA under an Emergency Use Authorization (EUA). This EUA will remain in effect (meaning this test can be used) for the duration of the COVID-19 declaration under Section 564(b)(1) of the Act, 21 U.S.C. section 360bbb-3(b)(1), unless the authorization is terminated or revoked.  Performed at [facility], [address] [place], [place] [address]     Labs: CBC: Recent Labs  Lab 01/05/24 1024  WBC 4.6  NEUTROABS 3.2  HGB 12.5  HCT 38.6  MCV 91.9  PLT 288   Basic Metabolic Panel: Recent Labs  Lab 01/05/24 1024  NA 137  K 3.8  CL 102  CO2 26  GLUCOSE 113*  BUN 14  CREATININE 1.03*  CALCIUM 9.1   Liver Function Tests: Recent Labs  Lab 01/05/24 1024  AST 18  ALT 13  ALKPHOS 69  BILITOT 0.4  PROT 7.3  ALBUMIN 4.6   CBG: No results for input(s): "GLUCAP" in the last 168 hours.  Discharge time spent: approximately 45 minutes spent on discharge counseling, evaluation of patient on day of discharge, and coordination of discharge planning with nursing, social work, pharmacy and case management  Signed: Alberteen Sam, MD [facility] 01/06/2024

## 2024-01-06 NOTE — Progress Notes (Signed)
   01/06/24 1015  TOC Brief Assessment  Insurance and Status Reviewed  Patient has primary care physician Yes  Home environment has been reviewed Single family home  Prior level of function: Independent  Prior/Current Home Services No current home services  Social Drivers of Health Review SDOH reviewed no interventions necessary  Readmission risk has been reviewed Yes  Transition of care needs no transition of care needs at this time

## 2024-02-10 ENCOUNTER — Ambulatory Visit
Admission: RE | Admit: 2024-02-10 | Discharge: 2024-02-10 | Disposition: A | Discharge status: Home / Self Care | Source: Ambulatory Visit | Attending: Cardiovascular Disease | Admitting: Cardiovascular Disease

## 2024-02-10 ENCOUNTER — Other Ambulatory Visit: Payer: Self-pay | Admitting: Cardiovascular Disease

## 2024-02-10 DIAGNOSIS — R079 Chest pain, unspecified: Secondary | ICD-10-CM

## 2024-08-11 ENCOUNTER — Other Ambulatory Visit: Payer: Self-pay

## 2024-08-11 ENCOUNTER — Emergency Department (HOSPITAL_COMMUNITY)

## 2024-08-11 ENCOUNTER — Emergency Department (HOSPITAL_COMMUNITY)
Admission: EM | Admit: 2024-08-11 | Discharge: 2024-08-11 | Disposition: A | Discharge status: Home / Self Care | Attending: Emergency Medicine | Admitting: Emergency Medicine

## 2024-08-11 DIAGNOSIS — R112 Nausea with vomiting, unspecified: Secondary | ICD-10-CM | POA: Diagnosis not present

## 2024-08-11 DIAGNOSIS — H53149 Visual discomfort, unspecified: Secondary | ICD-10-CM | POA: Diagnosis not present

## 2024-08-11 DIAGNOSIS — H9202 Otalgia, left ear: Secondary | ICD-10-CM | POA: Diagnosis not present

## 2024-08-11 DIAGNOSIS — Z7982 Long term (current) use of aspirin: Secondary | ICD-10-CM | POA: Insufficient documentation

## 2024-08-11 DIAGNOSIS — R519 Headache, unspecified: Secondary | ICD-10-CM | POA: Diagnosis present

## 2024-08-11 LAB — CBC WITH DIFFERENTIAL/PLATELET
Abs Immature Granulocytes: 0.02 K/uL (ref 0.00–0.07)
Basophils Absolute: 0.1 K/uL (ref 0.0–0.1)
Basophils Relative: 1 %
Eosinophils Absolute: 0.2 K/uL (ref 0.0–0.5)
Eosinophils Relative: 3 %
HCT: 41 % (ref 36.0–46.0)
Hemoglobin: 12.9 g/dL (ref 12.0–15.0)
Immature Granulocytes: 0 %
Lymphocytes Relative: 22 %
Lymphs Abs: 1.8 K/uL (ref 0.7–4.0)
MCH: 29.2 pg (ref 26.0–34.0)
MCHC: 31.5 g/dL (ref 30.0–36.0)
MCV: 92.8 fL (ref 80.0–100.0)
Monocytes Absolute: 0.5 K/uL (ref 0.1–1.0)
Monocytes Relative: 6 %
Neutro Abs: 5.7 K/uL (ref 1.7–7.7)
Neutrophils Relative %: 68 %
Platelets: 334 K/uL (ref 150–400)
RBC: 4.42 MIL/uL (ref 3.87–5.11)
RDW: 13.6 % (ref 11.5–15.5)
WBC: 8.3 K/uL (ref 4.0–10.5)
nRBC: 0 % (ref 0.0–0.2)

## 2024-08-11 LAB — COMPREHENSIVE METABOLIC PANEL WITH GFR
ALT: 16 U/L (ref 0–44)
AST: 28 U/L (ref 15–41)
Albumin: 4.9 g/dL (ref 3.5–5.0)
Alkaline Phosphatase: 87 U/L (ref 38–126)
Anion gap: 12 (ref 5–15)
BUN: 13 mg/dL (ref 8–23)
CO2: 24 mmol/L (ref 22–32)
Calcium: 10.2 mg/dL (ref 8.9–10.3)
Chloride: 104 mmol/L (ref 98–111)
Creatinine, Ser: 0.82 mg/dL (ref 0.44–1.00)
GFR, Estimated: >60 mL/min (ref >60)
Glucose, Bld: 117 mg/dL — ABNORMAL HIGH (ref 70–99)
Potassium: 4.5 mmol/L (ref 3.5–5.1)
Sodium: 141 mmol/L (ref 135–145)
Total Bilirubin: 0.5 mg/dL (ref 0.0–1.2)
Total Protein: 8.3 g/dL — ABNORMAL HIGH (ref 6.5–8.1)

## 2024-08-11 LAB — LIPASE, BLOOD: Lipase: 32 U/L (ref 11–51)

## 2024-08-11 MED ORDER — METOCLOPRAMIDE HCL 5 MG/ML IJ SOLN
5.0000 mg | Freq: Once | INTRAMUSCULAR | Status: AC
  Administered 2024-08-11: 5 mg via INTRAVENOUS
  Filled 2024-08-11: qty 2

## 2024-08-11 MED ORDER — DIPHENHYDRAMINE HCL 50 MG/ML IJ SOLN
12.5000 mg | Freq: Once | INTRAMUSCULAR | Status: AC
  Administered 2024-08-11: 12.5 mg via INTRAVENOUS
  Filled 2024-08-11: qty 1

## 2024-08-11 MED ORDER — LACTATED RINGERS IV BOLUS
1000.0000 mL | Freq: Once | INTRAVENOUS | Status: AC
  Administered 2024-08-11: 1000 mL via INTRAVENOUS

## 2024-08-11 MED ORDER — LACTATED RINGERS IV SOLN: INTRAVENOUS | Status: DC

## 2024-08-11 NOTE — ED Provider Notes (Addendum)
 [place] EM[facility]GENC[facility] DEPARTMENT AT [facility] Provider Note   CSN: [id number] Arrival date & time: 08/11/24  1047     Patient presents with: Emesis   Natalie Willis is a 78 y.o. female.   78 year old female presents with gradual onset of headache which began last night.  States that she has had headaches before in the past similar to this.  Denies any sudden onset.  She has had nausea and vomiting x 2.  Headache is localized to the frontal and goes to her bilateral cervical spine.  Notes some photophobia.  Her bilious has been nonbilious or bloody.  Does note some pain in her left ear as well to.  Denies any dental pain.  Denies any fever or chills.  No weakness in her arms or legs.  No visual changes.  No chest or abdominal discomfort.  Called EMS and was transported here       Prior to Admission medications   Medication Sig Start Date End Date Taking? Authorizing Provider  albuterol  (PROVENTIL  HFA;VENTOLIN  HFA) 108 (90 BASE) MCG/ACT inhaler Inhale 2 puffs into the lungs every 6 (six) hours as needed for wheezing.    [provider]  amLODipine  (NORVASC ) 10 MG tablet Take 1 tablet by mouth daily. 03/29/21   [provider]  aspirin  81 MG EC tablet Take 81 mg by mouth once a week. On wednesdays    [provider]  budesonide-formoterol  [facility]) 160-4.5 MCG/ACT inhaler Inhale 2 puffs into the lungs 2 (two) times daily.    [provider]  famotidine  (PEPCID ) 40 MG tablet Take 40 mg by mouth daily as needed for heartburn or indigestion.    [provider]  fluticasone  (FLONASE ) 50 MCG/ACT nasal spray Place 2 sprays into both nostrils at bedtime.    [provider]  guaiFENesin -codeine  100-10 MG/5ML syrup Take 10 mLs by mouth every 6 (six) hours as needed. 01/06/24   Danford, Lonni SQUIBB, MD  isosorbide  mononitrate (IMDUR ) 30 MG 24 hr tablet Take 30 mg by mouth daily. 05/25/21   [provider]  montelukast  (SINGULAIR) 10 MG tablet Take 10 mg by mouth daily.    [provider]  oseltamivir  (TAMIFLU ) 30 MG capsule Take 1 capsule (30 mg total) by mouth 2 (two) times daily. 01/06/24   Danford, Lonni SQUIBB, MD  rosuvastatin  (CRESTOR ) 20 MG tablet Take 20 mg by mouth daily.    [provider]  valsartan (DIOVAN) 320 MG tablet Take 320 mg by mouth daily.    [provider]  zolpidem  (AMBIEN ) 5 MG tablet Take 5 mg by mouth at bedtime as needed for sleep.    [provider]    Allergies: Penicillins, Acetaminophen , Contrast media [iodinated contrast media], Oxycodone  hcl, Dilaudid  [hydromorphone  hcl], Hydromorphone , Morphine , and Oxycodone -acetaminophen     Review of Systems  All other systems reviewed and are negative.   Updated Vital Signs There were no vitals taken for this visit.  Physical Exam Vitals and nursing note reviewed.  Constitutional:      General: She is not in acute distress.    Appearance: Normal appearance. She is well-developed. She is not toxic-appearing.  HENT:     Head: Normocephalic and atraumatic.  Eyes:     General: Lids are normal.     Conjunctiva/sclera: Conjunctivae normal.     Pupils: Pupils are equal, round, and reactive to light.  Neck:     Thyroid: No thyroid mass.     Trachea: No tracheal deviation.  Cardiovascular:     Rate and Rhythm: Normal rate and regular rhythm.     Heart sounds: Normal heart sounds. No murmur heard.    No gallop.  Pulmonary:     Effort: Pulmonary effort is normal. No respiratory distress.     Breath sounds: Normal breath sounds. No stridor. No decreased breath sounds, wheezing, rhonchi or rales.  Abdominal:     General: There is no distension.     Palpations: Abdomen is soft.     Tenderness: There is no abdominal tenderness. There is no rebound.  Musculoskeletal:        General: No tenderness. Normal range of motion.     Cervical back: Normal range of motion and neck supple.  Skin:     General: Skin is warm and dry.     Findings: No abrasion or rash.  Neurological:     General: No focal deficit present.     Mental Status: She is alert and oriented to person, place, and time. Mental status is at baseline.     GCS: GCS eye subscore is 4. GCS verbal subscore is 5. GCS motor subscore is 6.     Cranial Nerves: No cranial nerve deficit.     Sensory: No sensory deficit.     Motor: Motor function is intact.  Psychiatric:        Attention and Perception: Attention normal.        Speech: Speech normal.        Behavior: Behavior normal.     (all labs ordered are listed, but only abnormal results are displayed) Labs Reviewed - No data to display  EKG: None  Radiology: No results found.   Procedures   Medications Ordered in the ED  lactated ringers  bolus 1,000 mL (has no administration in time range)  lactated ringers  infusion (has no administration in time range)  metoCLOPramide  (REGLAN ) injection 5 mg (has no administration in time range)  diphenhydrAMINE  (BENADRYL ) injection 12.5 mg (has no administration in time range)                                    Medical Decision Making Amount and/or Complexity of Data Reviewed Labs: ordered. Radiology: ordered.  Risk Prescription drug management.   Review labs are reassuring.  Patient CT of the head and neck negative.  Patient symptoms consistent more with migraine type headache.  Onset was gradual without any neck pain or photophobia.  Low suspicion for subarachnoid hemorrhage.  She is afebrile.  No suspicion for meningitis.  After her medications she feels much better with preserved and is at her baseline.  Neurological exam remained stable.  Will discharge home with strict return precautions.  Patient did not require interpreter for this visit      Final diagnoses:  None    ED Discharge Orders     None          Dasie Faden, MD 08/11/24 1351    Dasie Faden, MD 08/11/24 1351

## 2024-08-11 NOTE — ED Triage Notes (Signed)
 BIB EMS from home.  N/V x 2 episodes with diarrhea, headache, light sensitivity that started last night.  Poor po intake.  VSS.
